# Patient Record
Sex: Female | Born: 1935 | Race: Black or African American | Hispanic: No | Smoking: Never smoker
Health system: Southern US, Community
[De-identification: ages and names within clinical notes are randomized; demographics above are authoritative.]

## PROBLEM LIST (undated history)

## (undated) DIAGNOSIS — Z951 Presence of aortocoronary bypass graft: Secondary | ICD-10-CM

## (undated) DIAGNOSIS — Z8673 Personal history of transient ischemic attack (TIA), and cerebral infarction without residual deficits: Secondary | ICD-10-CM

## (undated) DIAGNOSIS — N390 Urinary tract infection, site not specified: Secondary | ICD-10-CM

## (undated) DIAGNOSIS — I10 Essential (primary) hypertension: Secondary | ICD-10-CM

## (undated) DIAGNOSIS — F0390 Unspecified dementia without behavioral disturbance: Secondary | ICD-10-CM

## (undated) DIAGNOSIS — M109 Gout, unspecified: Secondary | ICD-10-CM

## (undated) DIAGNOSIS — E119 Type 2 diabetes mellitus without complications: Secondary | ICD-10-CM

---

## 2003-12-28 ENCOUNTER — Other Ambulatory Visit: Payer: Self-pay

## 2004-11-15 ENCOUNTER — Ambulatory Visit: Payer: Self-pay | Admitting: Unknown Physician Specialty

## 2004-12-20 ENCOUNTER — Ambulatory Visit: Payer: Self-pay | Admitting: Anesthesiology

## 2005-01-03 ENCOUNTER — Ambulatory Visit: Payer: Self-pay | Admitting: Anesthesiology

## 2005-02-07 ENCOUNTER — Ambulatory Visit: Payer: Self-pay | Admitting: Anesthesiology

## 2005-03-14 ENCOUNTER — Ambulatory Visit: Payer: Self-pay | Admitting: Anesthesiology

## 2005-04-04 ENCOUNTER — Ambulatory Visit: Payer: Self-pay | Admitting: Anesthesiology

## 2005-11-17 ENCOUNTER — Emergency Department: Payer: Self-pay | Admitting: Emergency Medicine

## 2005-11-17 ENCOUNTER — Other Ambulatory Visit: Payer: Self-pay

## 2006-05-30 ENCOUNTER — Other Ambulatory Visit: Payer: Self-pay

## 2006-05-31 ENCOUNTER — Inpatient Hospital Stay: Payer: Self-pay | Admitting: Internal Medicine

## 2006-07-01 ENCOUNTER — Ambulatory Visit: Payer: Self-pay | Admitting: Psychiatry

## 2010-09-03 ENCOUNTER — Observation Stay: Payer: Self-pay | Admitting: Internal Medicine

## 2011-04-22 ENCOUNTER — Ambulatory Visit: Payer: Self-pay | Admitting: Internal Medicine

## 2011-05-17 ENCOUNTER — Inpatient Hospital Stay: Payer: Self-pay | Admitting: Internal Medicine

## 2011-05-23 ENCOUNTER — Ambulatory Visit: Payer: Self-pay | Admitting: Internal Medicine

## 2011-05-31 ENCOUNTER — Emergency Department: Payer: Self-pay | Admitting: Emergency Medicine

## 2011-05-31 LAB — URINALYSIS, COMPLETE
Bacteria: NONE SEEN
Bilirubin,UR: NEGATIVE
Glucose,UR: NEGATIVE mg/dL (ref 0–75)
Leukocyte Esterase: NEGATIVE
Nitrite: NEGATIVE
Protein: NEGATIVE
RBC,UR: <1 /HPF (ref 0–5)
Specific Gravity: 1.012 (ref 1.003–1.030)
WBC UR: <1 /HPF (ref 0–5)

## 2011-06-01 LAB — URINE CULTURE

## 2011-06-18 ENCOUNTER — Emergency Department: Payer: Self-pay | Admitting: Unknown Physician Specialty

## 2011-06-18 LAB — URINALYSIS, COMPLETE
Bacteria: NONE SEEN
Bilirubin,UR: NEGATIVE
Blood: NEGATIVE
Glucose,UR: NEGATIVE mg/dL (ref 0–75)
Ketone: NEGATIVE
Leukocyte Esterase: NEGATIVE
Nitrite: NEGATIVE
Ph: 7 (ref 4.5–8.0)
Protein: NEGATIVE
RBC,UR: <1 /HPF (ref 0–5)
Specific Gravity: 1.006 (ref 1.003–1.030)
Squamous Epithelial: 1
WBC UR: 1 /HPF (ref 0–5)

## 2011-06-18 LAB — CBC
HCT: 33.3 % — ABNORMAL LOW (ref 35.0–47.0)
HGB: 10.7 g/dL — ABNORMAL LOW (ref 12.0–16.0)
MCH: 27 pg (ref 26.0–34.0)
MCHC: 32.2 g/dL (ref 32.0–36.0)
MCV: 84 fL (ref 80–100)
Platelet: 191 10*3/uL (ref 150–440)
RBC: 3.97 10*6/uL (ref 3.80–5.20)
RDW: 17.6 % — ABNORMAL HIGH (ref 11.5–14.5)
WBC: 4.4 10*3/uL (ref 3.6–11.0)

## 2011-06-18 LAB — BASIC METABOLIC PANEL
Anion Gap: 9 (ref 7–16)
Calcium, Total: 8.7 mg/dL (ref 8.5–10.1)
Chloride: 106 mmol/L (ref 98–107)
Co2: 30 mmol/L (ref 21–32)
EGFR (African American): 60 — ABNORMAL LOW
EGFR (Non-African Amer.): 49 — ABNORMAL LOW
Glucose: 155 mg/dL — ABNORMAL HIGH (ref 65–99)
Osmolality: 293 (ref 275–301)
Potassium: 3.9 mmol/L (ref 3.5–5.1)
Sodium: 145 mmol/L (ref 136–145)

## 2011-06-21 LAB — URINE CULTURE

## 2011-08-05 ENCOUNTER — Observation Stay: Payer: Self-pay | Admitting: Internal Medicine

## 2011-08-05 LAB — URINALYSIS, COMPLETE
Ph: 5 (ref 4.5–8.0)
Protein: NEGATIVE
RBC,UR: 2 /HPF (ref 0–5)
Squamous Epithelial: 14

## 2011-08-05 LAB — COMPREHENSIVE METABOLIC PANEL
Alkaline Phosphatase: 64 U/L (ref 50–136)
Anion Gap: 5 — ABNORMAL LOW (ref 7–16)
BUN: 26 mg/dL — ABNORMAL HIGH (ref 7–18)
Calcium, Total: 9 mg/dL (ref 8.5–10.1)
Chloride: 107 mmol/L (ref 98–107)
Co2: 31 mmol/L (ref 21–32)
EGFR (African American): 54 — ABNORMAL LOW
SGOT(AST): 26 U/L (ref 15–37)
SGPT (ALT): 20 U/L

## 2011-08-05 LAB — CBC
HCT: 32.1 % — ABNORMAL LOW (ref 35.0–47.0)
HGB: 10.4 g/dL — ABNORMAL LOW (ref 12.0–16.0)
MCH: 26.5 pg (ref 26.0–34.0)
MCV: 82 fL (ref 80–100)
Platelet: 265 10*3/uL (ref 150–440)
RBC: 3.92 10*6/uL (ref 3.80–5.20)

## 2011-08-06 LAB — URINE CULTURE

## 2011-08-12 LAB — CULTURE, BLOOD (SINGLE)

## 2011-09-09 ENCOUNTER — Emergency Department: Payer: Self-pay | Admitting: *Deleted

## 2011-09-09 LAB — URINALYSIS, COMPLETE
Blood: NEGATIVE
Glucose,UR: NEGATIVE mg/dL (ref 0–75)
Ketone: NEGATIVE
Leukocyte Esterase: NEGATIVE
Ph: 6 (ref 4.5–8.0)
RBC,UR: 1 /HPF (ref 0–5)
WBC UR: 1 /HPF (ref 0–5)

## 2011-09-09 LAB — CBC WITH DIFFERENTIAL/PLATELET
Basophil #: 0 10*3/uL (ref 0.0–0.1)
Eosinophil #: 0.1 10*3/uL (ref 0.0–0.7)
HCT: 35.7 % (ref 35.0–47.0)
Lymphocyte #: 1.3 10*3/uL (ref 1.0–3.6)
Lymphocyte %: 22.5 %
MCHC: 31.4 g/dL — ABNORMAL LOW (ref 32.0–36.0)
MCV: 82 fL (ref 80–100)
Monocyte #: 0.5 x10 3/mm (ref 0.2–0.9)
Neutrophil #: 3.8 10*3/uL (ref 1.4–6.5)
Platelet: 170 10*3/uL (ref 150–440)
RBC: 4.37 10*6/uL (ref 3.80–5.20)
RDW: 17.7 % — ABNORMAL HIGH (ref 11.5–14.5)
WBC: 5.7 10*3/uL (ref 3.6–11.0)

## 2011-09-09 LAB — TROPONIN I: Troponin-I: 0.02 ng/mL

## 2011-09-09 LAB — BASIC METABOLIC PANEL
Anion Gap: 7 (ref 7–16)
BUN: 16 mg/dL (ref 7–18)
Calcium, Total: 8.6 mg/dL (ref 8.5–10.1)
Chloride: 106 mmol/L (ref 98–107)
Co2: 31 mmol/L (ref 21–32)
Creatinine: 1.02 mg/dL (ref 0.60–1.30)
EGFR (African American): 58 — ABNORMAL LOW
EGFR (Non-African Amer.): 50 — ABNORMAL LOW
Glucose: 121 mg/dL — ABNORMAL HIGH (ref 65–99)
Potassium: 3.4 mmol/L — ABNORMAL LOW (ref 3.5–5.1)

## 2012-01-01 ENCOUNTER — Inpatient Hospital Stay: Payer: Self-pay | Admitting: Internal Medicine

## 2012-01-01 LAB — COMPREHENSIVE METABOLIC PANEL
BUN: 24 mg/dL — ABNORMAL HIGH (ref 7–18)
Bilirubin,Total: 0.4 mg/dL (ref 0.2–1.0)
Calcium, Total: 8.8 mg/dL (ref 8.5–10.1)
Chloride: 106 mmol/L (ref 98–107)
Co2: 27 mmol/L (ref 21–32)
EGFR (African American): 54 — ABNORMAL LOW
EGFR (Non-African Amer.): 46 — ABNORMAL LOW
Potassium: 4 mmol/L (ref 3.5–5.1)
SGOT(AST): 35 U/L (ref 15–37)
SGPT (ALT): 23 U/L (ref 12–78)
Sodium: 141 mmol/L (ref 136–145)
Total Protein: 7.8 g/dL (ref 6.4–8.2)

## 2012-01-01 LAB — URINALYSIS, COMPLETE
Bilirubin,UR: NEGATIVE
Ketone: NEGATIVE
Nitrite: NEGATIVE
Ph: 7 (ref 4.5–8.0)
Protein: NEGATIVE
RBC,UR: 3 /HPF (ref 0–5)
Squamous Epithelial: <1

## 2012-01-01 LAB — CBC
MCHC: 34.1 g/dL (ref 32.0–36.0)
RBC: 4.31 10*6/uL (ref 3.80–5.20)
RDW: 17.2 % — ABNORMAL HIGH (ref 11.5–14.5)

## 2012-01-02 LAB — BASIC METABOLIC PANEL
Anion Gap: 10 (ref 7–16)
Calcium, Total: 8.6 mg/dL (ref 8.5–10.1)
Chloride: 101 mmol/L (ref 98–107)
EGFR (African American): 53 — ABNORMAL LOW
Osmolality: 282 (ref 275–301)
Potassium: 3.5 mmol/L (ref 3.5–5.1)

## 2012-01-02 LAB — CBC WITH DIFFERENTIAL/PLATELET
Basophil #: 0.1 10*3/uL (ref 0.0–0.1)
Basophil %: 0.7 %
Eosinophil #: 0 10*3/uL (ref 0.0–0.7)
Eosinophil %: 0.6 %
HCT: 34.8 % — ABNORMAL LOW (ref 35.0–47.0)
HGB: 11.5 g/dL — ABNORMAL LOW (ref 12.0–16.0)
Lymphocyte %: 20.3 %
MCHC: 33.2 g/dL (ref 32.0–36.0)
MCV: 82 fL (ref 80–100)
Monocyte #: 0.8 x10 3/mm (ref 0.2–0.9)
Monocyte %: 9.9 %
Neutrophil #: 5.6 10*3/uL (ref 1.4–6.5)
RDW: 17.5 % — ABNORMAL HIGH (ref 11.5–14.5)

## 2012-01-03 LAB — URINE CULTURE

## 2012-01-05 ENCOUNTER — Encounter: Payer: Self-pay | Admitting: Internal Medicine

## 2012-01-05 LAB — CULTURE, BLOOD (SINGLE)

## 2012-01-21 ENCOUNTER — Encounter: Payer: Self-pay | Admitting: Internal Medicine

## 2013-01-13 IMAGING — CR DG CHEST 1V PORT
1 series · 1 of 1 positions shown · non-contrast
Comparison: none

REASON FOR EXAM: WEAKNESS
COMMENTS:

[portable]
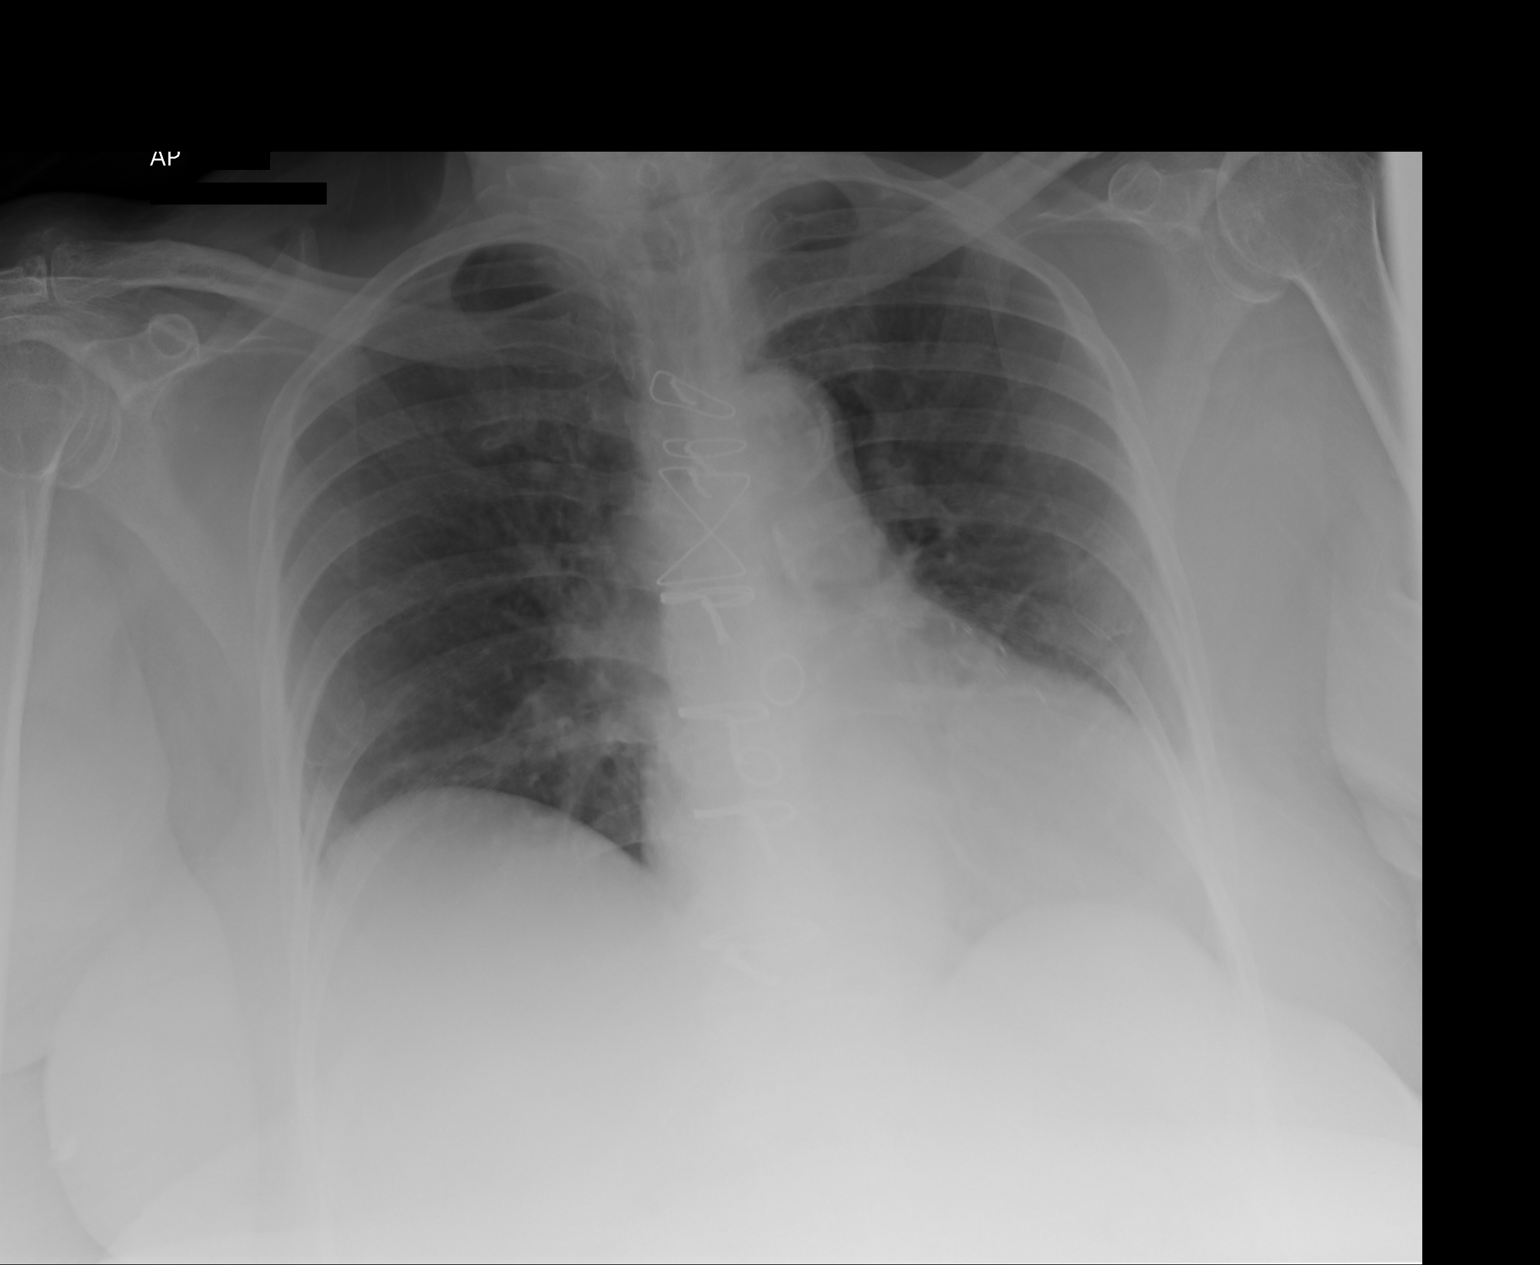

[1 of 1 positions shown; findings below may reference images not displayed]

PROCEDURE:     DXR - DXR PORTABLE CHEST SINGLE VIEW  - January 01, 2012  [DATE]

RESULT:

Comparison is made to the study 18 June, 2011.

There is a band of density at the right lung base suggestive of atelectasis.
The heart is borderline enlarged to mildly enlarged without edema, effusion
or pneumothorax. CABG changes are present.
IMPRESSION: 1.     Right lung base atelectasis.
2.     Borderline to mild cardiomegaly. CABG.

[REDACTED]

## 2013-05-30 ENCOUNTER — Other Ambulatory Visit: Payer: Self-pay | Admitting: Family Medicine

## 2013-05-30 LAB — URINALYSIS, COMPLETE
BLOOD: NEGATIVE
Bilirubin,UR: NEGATIVE
Glucose,UR: NEGATIVE mg/dL (ref 0–75)
Ketone: NEGATIVE
Nitrite: NEGATIVE
Ph: 8 (ref 4.5–8.0)
Protein: >=500
RBC,UR: 8 /HPF (ref 0–5)
SPECIFIC GRAVITY: 1.022 (ref 1.003–1.030)
WBC UR: 21 /HPF (ref 0–5)

## 2013-06-03 LAB — URINE CULTURE

## 2014-09-08 NOTE — H&P (Signed)
PATIENT NAME:  Stephanie Salas, Stephanie Salas MR#:  [id number] DATE OF BIRTH:  [birth year]  DATE OF ADMISSION:  01/01/2012  PRIMARY CARE PHYSICIAN:  Francia Greavesheryl Jeffries, MD   CHIEF COMPLAINT:  The patient was transferred from assisted nursing facility with generalized weakness and low-grade temperature.   HISTORY OF PRESENT ILLNESS: Stephanie Salas is an 81 year old African American female with history of Alzheimer's dementia, systemic hypertension, and coronary artery disease. The patient was noticed to have generalized weakness. The nursing staff at the assisted nursing facility felt  that she was generally weaker than usual and barely able to stand up and walk. She was noticed to have a low-grade fever. The patient was transferred by EMS to the hospital for further evaluation. The patient denies to me everything and states she is fine. Work-up here is unremarkable except for a temperature of 100.     REVIEW OF SYSTEMS: CONSTITUTIONAL: The patient denies having fever, although she has a low-grade fever. No chills, but she has generalized fatigue- even that she is not admitting.  EYES: No blurring of vision. No double vision. ENT: No hearing impairment. No sore throat. No dysphagia. CARDIOVASCULAR: No chest pain. Denies any shortness of breath. No palpitations. RESPIRATORY: No cough. No sputum production. No chest pain. No shortness of breath. GASTROINTESTINAL: No abdominal pain. No vomiting. No diarrhea. GENITOURINARY: No dysuria or frequency of urination. MUSCULOSKELETAL: No joint pain or swelling. No muscular pain or swelling. INTEGUMENT: No skin rash. No ulcers.  NEUROLOGIC: No focal weakness but she has generalized weakness. No seizure activity. No headache. PSYCHIATRY: No anxiety. No depression. ENDOCRINE: No polyuria, no polydipsia. No heat or cold intolerance.   PAST MEDICAL HISTORY:  1. Alzheimer's dementia.  2. History of stroke in the past.  3. Systemic hypertension.  4. Mitral regurgitation. 5. Coronary  artery disease status post coronary artery bypass graft. 6. Gout. 7. Right eye blindness.  8. Diabetes mellitus.  9. Hyperlipidemia. 10. Osteoarthritis.   PAST SURGICAL HISTORY:  1. Coronary artery bypass graft.  2. Hysterectomy. 3. Bilateral knee joint replacements.   SOCIAL HABITS: The patient does not provide much history about her habits, but according to her old records there is no history of tobacco or alcohol or drug abuse.   SOCIAL HISTORY: She used to live with her daughter in [place], but right now she is in [facility] assisted facility.   FAMILY HISTORY: According to her medical records, her mother lived to be 100 without any significant health issues.   ADMISSION MEDICATIONS:  1. Allopurinol 100 mg once a day. 2. Colcrys 0.6 mg once a day. 3. Amlodipine 5 mg once a day. 4. Aricept or donepezil 10 mg a day. 5. Namenda 10 mg once a day. 6. Pantoprazole or Protonix 40 mg once a day. 7. Metoprolol tartrate 25 mg twice a day.  8. Plavix 75 mg once a day. 9. Lisinopril 20 mg once a day. 10. Hydrocodone with acetaminophen 5/325, 1 to 2 tablets every four hours p.r.n. for pain.  11. Aspirin 81 mg a day. 12. Pravastatin 40 mg a day. 13. Zofran 4 mg q. 6 hours p.r.n. for nausea or vomiting.   ALLERGIES: Penicillin.   PHYSICAL EXAMINATION:  VITAL SIGNS: Blood pressure 142/90,  respiratory rate 18, pulse 87, temperature 100, oxygen saturation 96% on room air.   GENERAL APPEARANCE: Elderly lady lying in bed in no acute distress.   HEAD: No pallor. No icterus. No cyanosis.   ENT: Hearing was normal. Nasal mucosa, lips, tongue were  normal.   EYES: Eye examination revealed normal eyelids and conjunctivae. Pupils about 4 to 5 mm, equal and reactive to light.   NECK: Supple. Trachea at midline. No thyromegaly. No cervical lymphadenopathy. No masses.   HEART: Normal S1, S2. No S3, S4. No murmur. No gallop. No carotid bruits.   RESPIRATORY: Normal breathing  pattern without use of accessory muscles.  Few scattered rales at the bases. No wheezing.   ABDOMEN: Soft without tenderness. No hepatosplenomegaly. No masses. No hernias.   SKIN: No ulcers. No subcutaneous nodules.   MUSCULOSKELETAL: No joint swelling. No clubbing.   NEUROLOGIC: Cranial nerves II through XII intact. No focal motor deficits.   PSYCHIATRY: The patient is alert, does not know where she is or why she is here. Disoriented to time as well.   LABS/STUDIES: Chest x-ray showed no consolidation, no effusion. EKG showed normal sinus rhythm at a rate of 87 per minute, first-degree AV block, left axis deviation, otherwise unremarkable EKG. Serum glucose 108, BUN 24, creatinine 1.09, sodium 141, potassium 4. Liver function tests were normal except for slightly low albumin at 3.3. CBC showed white count of 9400, hemoglobin 12, hematocrit 35, platelet count 156. Urinalysis was unremarkable.   ASSESSMENT:  1. Generalized weakness. Etiology is unclear but could be viral prodrome syndrome versus infection of other etiology.  2. Low-grade fever. The source is unclear.  3. Alzheimer's dementia.  4. Systemic hypertension.  5. Coronary artery disease.  6. History of stroke. 7. Multiple presentations to the hospital with syncope-like diagnosis.  8. Osteoarthritis.  9. Diabetes mellitus.  10. Hyperlipidemia.  11. History of mitral regurgitation.   PLAN: We will admit the patient to the medical floor. Blood cultures times two were taken, urine for culture as well. Monitor her temperature. The patient received an order for ciprofloxacin for presumed urinary tract infection. However, I do not have this evidence. I will hold any antibiotic treatment until I have clarification pending results of the blood and urine cultures. I will involve and consult with physical therapy. Continue the rest of her home medications as listed above. I cannot assess with the patient if she has a LIVING WILL given her  dementia.   TIME SPENT: Time spent evaluating this patient and reviewing medical records and obtaining the medication list from the nursing home, which was not sent with the patient: More than one hour.   ____________________________ Carney Corners. Rudene Re, MD amd:bjt D: 01/01/2012 04:51:19 ET T: 01/01/2012 09:20:24 ET JOB#: [id number]  cc: Carney Corners. Rudene Re, MD, <Dictator> Leitha Bleak. Lin Givens, MD Carney Corners Javin Nong MD ELECTRONICALLY SIGNED 01/01/2012 22:17

## 2014-09-08 NOTE — Discharge Summary (Signed)
PATIENT NAME:  Stephanie Salas, Quinetta M MR#:  [id number] DATE OF BIRTH:  Jan 01, 1924  DATE OF ADMISSION:  01/01/2012 DATE OF DISCHARGE:  01/04/2012  PRIMARY CARE PHYSICIAN: Francia Greavesheryl Jeffries, MD  FINAL DIAGNOSES:  1. Weakness.  2. Low-grade fever, possible pneumonia.  3. History of cerebrovascular accident.  4. Hypertension.  5. Gout.  6. Hyperlipidemia.  7. Dementia.   DISCHARGE MEDICATIONS:   1. Protonix 40 mg daily.  2. Metoprolol 25 mg twice a day. 3. Plavix 75 mg daily.  4. Namenda 10 mg twice a day. 5. Lisinopril 20 mg daily.  6. Aspirin 81 mg daily.  7. Colchicine 0.6 mg daily.  8. Norvasc 5 mg daily.  9. Allopurinol 100 mg daily.  10. Norco 5/325 mg 1 tablet every six hours as needed for pain.  11. Benazepril 10 mg at bedtime.  12. Acetaminophen 325 mg 1 tablet every four hours as needed for pain or temperature.  13. Zofran 4 mg every eight hours as needed for nausea.  14. Levaquin 500 mg every day for seven days, then stop.   NOTE: Do not take pravastatin.   DIET: Low sodium diet, regular consistency.  ACTIVITY: As tolerated.   REFERRAL: Physical therapy.   DISCHARGE FOLLOWUP: Follow up in 1 to 2 days with doctor at rehab.   REASON FOR ADMISSION: The patient was admitted 01/01/2012 and discharged 01/04/2012. She came in with weakness and low-grade temperature.   HISTORY OF PRESENT ILLNESS: This is an 81 year old female with Alzheimer's dementia, weaker and unable to walk and had a low-grade temp. The patient was given a dose of Cipro in the Emergency Room for possible urinary tract infection. Unclear initially where the source was.   LABORATORY, DIAGNOSTIC AND RADIOLOGIC DATA: EKG showed first-degree AV block, septal infarct.   Blood culture grew out Coag-negative staph, possible contamination with skin flora in one bottle and the other three bottles were negative.   Glucose 108, BUN 24, creatinine 1.09, sodium 141, potassium 4.0, chloride 106, CO2 27, and calcium 8.8.  Liver function tests: Albumin low at 3.3. GFR was 54. White blood cell count 9.4, hemoglobin and hematocrit 12.0 and 35.2, and platelet count 156.   Urine culture negative.   First chest x-ray showed right lung base atelectasis.   Urinalysis showed trace leukocyte esterase.   White count remained normal, 8.1 upon discharge.   Repeat chest x-ray showed mild interstitial edema versus interstitial infiltrate.   HOSPITAL COURSE PER PROBLEM LIST:  1. For the patient's weakness, physical therapy did see the patient. The patient would not be accepted back to the patient's current facility and needs more care and will be sent to rehab to try to get the patient stronger.  2. Low-grade fever, possibility of pneumonia on the repeat chest x-ray. Initially Cipro was started. With a positive blood culture vancomycin was added and then the blood culture came back contaminant, vancomycin was stopped and the patient's Cipro was switched over to Levaquin for better coverage on the lung with repeat chest x-ray showing possible interstitial infiltrate. Temperature curve came down, afebrile upon discharge.  3. History of cerebrovascular accident. On aspirin and Plavix.  4. Hypertension. Blood pressure was variable during the hospitalization. Most of the time good, sometimes low, sometimes high depending on the patient's mood. Continue current medications.  5. Gout. The patient is on allopurinol and colchicine.  6. Hyperlipidemia. I stopped the statin with the patient's weakness.  7. Dementia. The patient is on Aricept and Namenda.  8. Chronic  kidney disease, stage III. Stable.  9. Gastroesophageal reflux disease. On Protonix.  TIME SPENT ON ADMISSION: 55 minutes.  ____________________________ Herschell Dimes. Renae Gloss, MD rjw:slb D: 01/04/2012 10:14:29 ET T: 01/04/2012 10:45:48 ET JOB#: [id number]  cc: Herschell Dimes. Renae Gloss, MD, <Dictator> Cheryl L. Lin Givens, MD Salley Scarlet MD ELECTRONICALLY SIGNED 01/05/2012  15:43

## 2014-09-08 NOTE — Discharge Summary (Signed)
PATIENT NAME:  Stephanie Salas, Analeese M MR#:  [id number] DATE OF BIRTH:  13-Oct-1923  DATE OF ADMISSION:  01/01/2012 DATE OF DISCHARGE:  01/05/2012  ADDENDUM  This is an addendum to the discharge summary dictated yesterday. Please see that for all discharge instructions. The reason why the patient had to stay in the hospital overnight is the insurance company did not approve rehab until today, 01/05/2012. We finally got approval. Patient will be sent out to rehab. There was no change in the patient's clinical condition. Patient did have temperature of 99.1. Medications: No change. Patient stable for discharge.   TIME SPENT ON DISCHARGE: Less than 30 minutes.   ____________________________ Herschell Dimesichard J. Renae GlossWieting, MD rjw:cms D: 01/05/2012 11:39:46 ET T: 01/05/2012 11:45:04 ET JOB#: [id number]  cc: Herschell Dimesichard J. Renae GlossWieting, MD, <Dictator>  Salley ScarletICHARD J Garcia Dalzell MD ELECTRONICALLY SIGNED 01/05/2012 15:44

## 2014-09-13 NOTE — H&P (Signed)
PATIENT NAME:  Stephanie Salas, Stephanie Salas MR#:  [id number] DATE OF BIRTH:  [birth year]  DATE OF ADMISSION:  05/17/2011  REFERRING PHYSICIAN: Dr. Manson PasseyBrown   PRIMARY CARE PHYSICIAN: Dr. Lin GivensJeffries    PRESENTING COMPLAINT: Altered mental status.   HISTORY OF PRESENT ILLNESS: Stephanie Salas is an 81 year old woman with history of Alzheimer's dementia, CVA, hypertension, coronary artery disease status post bypass, and hyperlipidemia who presents with her grandson and her granddaughter. The patient herself is unable to provide history. According to her grandson who does not reside with her, he saw her today after being told by his mother that she has not been feeling well. She has been complaining of pain in her feet with gout flare. She has not been getting out of bed, difficulty even with assistance to transfer her out of the bed to the commode or to a chair. She apparently had a similar episode back in April 2012 where she was admitted for evaluation of questionable recurrent syncope. Apparently she goes in and out of consciousness. She becomes limp and lethargic and loses desire to eat. Her granddaughter attempted to give her Gatorade today and she had a vomiting spell after the intake again similar to her last episode. She sometimes has some urine and bowel incontinence but this time did not have that. At baseline her family reports that she has in and out state of awareness but her grandson reports that her dementia may be getting worse although it is not clear based on what his mother has been relaying. It also appears that this is her second episode this year and last year she had one episode.   PAST MEDICAL HISTORY:  1. Admitted April 2012 for evaluation of multiple syncopal episodes. She had an echocardiogram that showed mitral regurgitation.  2. History of cerebrovascular accident.  3. Gout.  4. Hypertension.  5. Mitral regurgitation.  6. Alzheimer's dementia.  7. Coronary artery disease status post three  vessel bypass and MI.  8. Right eye blindness.   9. Diet controlled diabetes.  10. Anemia.  11. Osteoarthritis/degenerative disk disease/spinal stenosis.  12. Hyperlipidemia.  13. Allergic rhinitis.   PAST SURGICAL HISTORY:  1. History of bypass in 2000.  2. Hysterectomy.  3. Bilateral knee replacement.   SOCIAL HISTORY: She lives with her daughter in [place]. No tobacco, alcohol, or drug use.   FAMILY HISTORY: Her mother lived to be 100 without any significant health issues.   ALLERGIES: Penicillin.   MEDICATIONS:  1. Donepezil 10 mg daily.  2. Lisinopril/hydrochlorothiazide 20/12.5 daily.  3. Aspirin 81 mg daily.  4. Namenda 10 mg b.i.d.  5. Omeprazole 20 mg daily.  6. Plavix 75 mg daily.  7. Pravastatin 40 mg daily.  8. Naprosyn 500 mg daily.  9. Metoprolol tartrate 25 mg b.i.d.   REVIEW OF SYSTEMS: Unable to obtain due to the patient's dementia.   PHYSICAL EXAMINATION:   VITAL SIGNS: Temperature 98.1, pulse 81, respiratory rate 18, blood pressure 175/79, sating at 93% on room air.   GENERAL: Lying in bed in no apparent distress.   HEENT: Normocephalic, atraumatic. Pupils equal, symmetric. Nares without discharge. Moist mucous membrane.   NECK: Soft and supple. No adenopathy or JVP.   CARDIOVASCULAR: Non-tachy. No murmurs, rubs, or gallops.   LUNGS: Clear to auscultation bilaterally. No use of accessory muscles or increased respiratory effort.   ABDOMEN: Soft. Positive bowel sounds. No mass appreciated.   EXTREMITIES: She has no edema but minimal palpation of her left foot and left great  toe causes pain.   NEUROLOGIC: The patient has symmetrical squeeze.   PSYCH: She is alert but not oriented.   PERTINENT LABS AND STUDIES: Urinalysis with specific gravity of 1.002, blood 1+, pH 5, RBC less than 1 per high-power field, WBC less than 1 per high-power field. CK 65. MB 0.6. Troponin less than 0.02. INR 1.1. WBC 7.7, hemoglobin 14.7, hematocrit 44.7, platelets  188, MCV 84, glucose 191, BUN 21, creatinine 1.37, sodium 134, potassium 3.9, chloride 95, carbon dioxide 28, calcium 9.1, albumin 3.3. Ultrasound of the abdomen reportedly with gallstones. CT of the head without acute findings.   ASSESSMENT AND PLAN: Stephanie Salas is an 81 year old woman with history of Alzheimer's dementia, CVA, hypertension, coronary artery disease, mitral regurgitation, diet controlled diabetes, anemia, and osteoarthritis presenting with altered mental status, failure to thrive, nausea and vomiting.  1. Altered mental status, questionable syncope versus seizure versus TIA versus progression of dementia. Discussed at length with family. Her grandson appears receptive in looking at alternative care. She was admitted with a similar episode back in April 2012. Her echo was unrevealing except for mitral regurgitation and again CT of the head shows no acute findings. Will obtain Neurology consult. Obtain an EEG. Will hold off on MRI unless recommended by Neurology. If these truly are seizure episodes given this is her second event, questionable if initiating medication would be warranted. This can be decided via Neurology. Will send a prolactin level, ammonia, B12, folate, TSH. Obtain Palliative Care consultation.  2. Renal insufficiency and dehydration with poor p.o. and vomiting. Continue IV fluids. Daily creatinine and I's and O's. Ultrasound showing only for gallstones.  3. Gout flare. Hold HCTZ. Restart Naprosyn and start her on colchicine b.i.d. Hold off on steroids given presenting with delirium. Get PT evaluation.  4. Hypertension, uncontrolled. Restart lisinopril. As above, hold HCTZ.  5. Cerebrovascular accident. Restart Plavix, aspirin, and pravastatin. As above, questionable if these episodes are related to TIA event. Will monitor patient on off-unit tele.  6. Prophylaxis with aspirin, Lovenox, Plavix, and omeprazole.   TIME SPENT: Approximately 50 minutes spent on patient care.    ____________________________ Reuel Derby, MD ap:drc D: 05/17/2011 06:26:18 ET T: 05/17/2011 09:21:47 ET JOB#: [id number]  cc: Tunis Gentle, MD, <Dictator> Cheryl L. Lin Givens, MD Reuel Derby MD ELECTRONICALLY SIGNED 06/10/2011 2:21

## 2014-09-13 NOTE — H&P (Signed)
PATIENT NAME:  Stephanie Salas, Stephanie Salas MR#:  [id number] DATE OF BIRTH:  [birth year]  DATE OF ADMISSION:  08/05/2011  PRIMARY CARE PHYSICIAN: Francia Greaves, MD  CHIEF COMPLAINT: Painful right foot.   HISTORY OF PRESENT ILLNESS: This is an 81 year old female who a few days ago started having swelling and redness in her right ankle and foot. She does have a history of gout. She was started on some colchicine.  She has had very little relief from it and it has gotten to the point that she cannot ambulate anymore. She is demented so it is hard to get a history from her, but the foot is obviously swollen. She has had no fever or chills that we know of.   PAST MEDICAL HISTORY:  1. Gouty arthritis.  2. History of cerebrovascular accident.  3. Alzheimer's dementia.  4. Hypertension.  5. Coronary artery disease.  6. Right eye blindness.  7. Diet-controlled diabetes.  8. Anemia.  9. Hyperlipidemia.  10. Osteoarthritis.   PAST SURGICAL HISTORY:  1. Coronary artery bypass graft.  2. Hysterectomy.  3. Bilateral knee replacements.   ALLERGIES: Penicillin.   CURRENT MEDICATIONS:  1. Pravastatin 40 mg daily.  2. Colchicine 0.6 mg twice a day. 3. Percocet 5/325 mg every 4 hours p.r.n.  4. Tylenol 650 mg every 4 hours p.r.n.  5. Zofran 4 mg every six hours p.r.n.  6. Amlodipine 5 mg daily.  7. Allopurinol 100 mg daily.  8. Donepezil 10 mg daily.  9. Protonix 40 mg daily.  10. Metoprolol 25 mg twice a day. 11. Plavix 75 mg daily.  12. Namenda 10 mg twice a day. 13. Lisinopril 20 mg daily.  14. Aspirin 81 mg daily.   SOCIAL HISTORY: She lives in assisted living.   FAMILY HISTORY: Unable to obtain from the patient because of her dementia.  REVIEW OF SYSTEMS: Unable to obtain from the patient because of her dementia.   PHYSICAL EXAMINATION:   VITAL SIGNS: Temperature 97.2, pulse 82, respirations 18, blood pressure 110/59.   GENERAL: This is a well-nourished white female who is pleasantly  demented.   HEENT: Pupils are equal, round, and reactive to light. Sclerae anicteric. Oral mucosa is moist. Oropharynx is clear. Nasopharynx is clear.   NECK: Supple. No JVD, lymphadenopathy or thyromegaly.   HEART: Regular rate and rhythm. No murmurs, rubs, or gallops.   LUNGS: Clear to auscultation. No dullness to percussion. She is not using accessory muscles.   ABDOMEN: Soft, nontender, and nondistended. Bowel sounds are positive. No hepatosplenomegaly. No masses.   EXTREMITIES: Left lower extremity has no edema. The right ankle is edematous with some erythema, very tender to even light touch.   NEUROLOGIC: Cranial nerves II through XII are grossly intact. She is alert but not oriented.   SKIN: Moist with no rash.  LABS/STUDIES: Urinalysis shows white blood cells and bacteria.   White blood cells 6 and hemoglobin is 10.4.   ASSESSMENT AND PLAN:  1. Acute gouty arthritis: She is obviously having a flare-up of her gout. She has been on some colchicine. We will continue that. I will go ahead and add IV steroids for acute treatment. There is a question about some possible cellulitis, but she will be on antibiotics for her UTI that should cover this anyway. 2. Urinary tract infection: We will get cultures and start her on some IV Levaquin.  3. Dementia: This appears to be stable.  4. Coronary artery disease: We will continue her current medications.   TIME  SPENT ON ADMISSION: 40 minutes. ____________________________ Gracelyn NurseJohn D. Roslind Michaux, MD jdj:slb D: 08/05/2011 10:00:43 ET T: 08/05/2011 10:27:26 ET JOB#: [id number]  cc: Gracelyn NurseJohn D. Asier Desroches, MD, <Dictator> Cheryl L. Lin GivensJeffries, MD Gracelyn NurseJOHN D Shahara Hartsfield MD ELECTRONICALLY SIGNED 08/05/2011 15:15

## 2014-09-13 NOTE — Discharge Summary (Signed)
PATIENT NAME:  Stephanie Salas, Stephanie Salas MR#:  [id number] DATE OF BIRTH:  [birth year]  DATE OF ADMISSION:  08/05/2011 DATE OF DISCHARGE:  08/06/2011  ADMITTING PHYSICIAN: Dr. Marcelino DusterJohn Johnston DISCHARGING PHYSICIAN:  Dr. Larena GlassmanAmir Zakyra [place] PRIMARY CARE PHYSICIAN: Dr. Francia Greavesheryl Jeffries   ADMITTING DIAGNOSIS: Painful right foot.  DISCHARGE DIAGNOSES:  1. Painful right foot secondary to acute gouty attack.  2. Debilitation.  3. Hypertension.  4. Dementia.  5. Urinary tract infection.  6. Coronary artery disease.  7. Hyperlipidemia.    TESTS DONE DURING HOSPITALIZATION: Right foot complete x-ray 03/16 showed there are periarticular erosive changes at the first MTP. There is periarticular erosion along the medial base of the first proximal phalanx. There is amorphous calcification adjacent to the medial aspect of the first metatarsal head. These findings can be seen in a crystalline arthropathy such as gout.   HOSPITAL COURSE: Initial History and Physical were done by Dr. Marcelino DusterJohn Johnston. Please refer to his note dated 08/05/2011 for complete details. In brief, this is an 81 year old African American female with history of Alzheimer's dementia, hypertension, cerebrovascular accident, and gout who presented with redness in her right ankle and foot. The patient has a history of gout and was started on some colchicine with little or no relief. She has dementia and it was hard to get a proper history on admission. She was admitted to the hospitalist service.  1. Acute gouty arthritis, having flare-up of gout. The patient had been on some colchicine. She was  continued on colchicine and allopurinol. She was on IV Solu-Medrol and switched over to prednisone prior to discharge. Her swelling improved significantly. She was evaluated by PT and recommended for Home Health and physical therapy.  2. Urinary tract infection based on urinalysis. The patient received two days of IV Levaquin and was switched to oral Levaquin upon  discharge for five additional days. 3. Alzheimer's dementia. This has been stable. The patient was continued on Namenda and Aricept. 4. Coronary artery disease. The patient was continued on aspirin and Plavix.  5. Hyperlipidemia. The patient was continued on statin.  6. Hypertension. The patient was stable on metoprolol, lisinopril, and amlodipine. 7. Deep vein thrombosis prophylaxis was maintained with Lovenox and aspirin.  The patient was discharged on 08/06/2011. Temperature 98.1, heart rate 69, respiratory rate 20, blood pressure 151/73, sating 95% on room air.  Lungs clear to auscultation.  Cardiovascular- regular rate and rhythm. Abdomen benign.   DISCHARGE MEDICATIONS:  1. MAPAP 325, 2 tablets every four hours as needed. 2. Zofran 4 mg every eight hours as needed.  3. Percocet 5/325, 1 tablet p.o. q. 6 hours as needed.  4. Aricept 10 mg once a day.  5. Protonix 40 mg daily.  6. Metoprolol tartrate 25 mg 1 tablet twice daily.  7. Plavix 75 mg daily.  8. Namenda 10 mg 2 times a day.  9. Lisinopril 20 mg daily.  10. Aspirin 81 mg daily.  11. Pravastatin 40 mg daily.  12. Colchicine 0.6 mg daily.  13. Norvasc 5 mg daily. 14. Allopurinol 100 mg daily.  15. Levaquin 500 mg p.o. daily times #5. 16. Prednisone 40 mg, taper by 10 mg every two days.  DIET:  ADA diet.   ACTIVITY: As tolerated with assistance and home physical therapy.  CODE STATUS: FULL CODE.  FOLLOWUP: She should follow up with Dr. Lin GivensJeffries or whoever she finds as her primary care physician to replace Dr. Lin GivensJeffries in one week.      TOTAL TIME SPENT  ON DISCHARGE: 45 minutes.     ____________________________ Corie Chiquito Lafayette Dragon, MD aaf:bjt D: 08/06/2011 22:50:12 ET T: 08/08/2011 11:11:08 ET JOB#: [id number]  cc: Karolee Ohs A. Lafayette Dragon, MD, <Dictator> Cheryl L. Lin Givens, MD Coralyn Pear MD ELECTRONICALLY SIGNED 08/08/2011 21:32

## 2014-09-13 NOTE — Discharge Summary (Signed)
PATIENT NAME:  Stephanie Salas, Stephanie Salas MR#:  [id number] DATE OF BIRTH:  [birth year]  DATE OF ADMISSION:  05/17/2011 DATE OF DISCHARGE:  05/22/2011  REASON FOR ADMISSION: Altered mental status.   HISTORY OF PRESENT ILLNESS: Please see the dictated HPI done by Dr. Pearlean BrownieAlounthith Phichith on 05/17/2011.   PAST MEDICAL HISTORY:  1. Alzheimer's dementia.  2. Previous stroke.  3. Arteriosclerotic cardiovascular disease status post coronary artery bypass grafting.  4. Benign hypertension.  5. Hyperlipidemia.  6. Gout.  7. Mitral regurgitation.  8. Right eye blindness.  9. Type 2 diabetes.  10. Anemia of chronic disease.  11. Degenerative disk disease with spinal stenosis.  12. Osteoarthritis.   MEDICATIONS ON ADMISSION: Please see admission note.   ALLERGIES: Penicillin.   SOCIAL HISTORY, FAMILY HISTORY AND REVIEW OF SYSTEMS: As per admission note.   PHYSICAL EXAM: The patient was in no acute distress. Vital signs were stable and she was afebrile. HEENT exam was unremarkable. Neck was supple without jugular venous distention. Lungs were clear. Cardiac exam revealed a regular rate and rhythm with normal S1 and S2. Abdomen was soft and nontender. Extremities were without edema. Neurologic exam was grossly nonfocal.   HOSPITAL COURSE: The patient was admitted with altered mental status with questionable syncope versus transient ischemic attack versus progressive dementia. The patient was admitted to the medical floor and monitored. Palliative care consult was obtained. Her altered mental status stabilized without further evidence of stroke or seizures. No evidence of infection was found during the hospitalization. Neurologic examination did not suggest an acute neurologic event. It was felt to be progression of the patient's underlying dementia associated with hypertension. Her medications were adjusted to improve her blood pressure. There was some component of metabolic encephalopathy due to hypertension;  this improved. Placement was recommended and the family agreed to assisted living placement. The patient was discharged to assisted living on 05/22/2011 in stable condition.   DISCHARGE DIAGNOSES:  1. Accelerated hypertension.  2. Hypertensive encephalopathy.  3. Progressive dementia. 4. Previous stroke.  5. Hyperlipidemia.  6. Type 2 diabetes.  7. Anemia of chronic disease.  8. Alzheimer's dementia.   DISCHARGE MEDICATIONS:  1. Aricept 10 mg p.o. at bedtime.  2. Lopressor 25 mg p.o. twice a day. 3. Namenda 10 mg p.o. twice a day. 4. Pravastatin 40 mg p.o. at bedtime.  5. Norvasc 10 mg p.o. daily.  6. Aspirin 81 mg p.o. daily.  7. Zestril 20 mg p.o. daily.  8. Plavix 75 mg p.o. daily.  9. Protonix 40 mg p.o. daily.  10. Zofran 4 mg p.o. every six hours p.r.n. nausea.  11. Norco 5/325 mg 1 to 2 p.o. every four hours p.r.n. pain.  FOLLOW-UP PLANS AND APPOINTMENTS: The patient was discharged on a mechanical soft diet. She was instructed not to take her omeprazole, lisinopril/HCT, or colchicine. She will    be at the assisted living facility. She will follow up with her regular physician in 1 to 2 weeks, sooner if needed.  ____________________________ Duane LopeJeffrey D. Judithann SheenSparks, MD jds:slb D: 06/03/2011 01:29:00 ET T: 06/04/2011 12:50:50 ET JOB#: [id number]  cc: Duane LopeJeffrey D. Judithann SheenSparks, MD, <Dictator> Stacey Sago Rodena Medin Lanie Schelling MD ELECTRONICALLY SIGNED 06/04/2011 13:11

## 2015-02-05 ENCOUNTER — Inpatient Hospital Stay
Admission: EM | Admit: 2015-02-05 | Discharge: 2015-02-20 | DRG: 393 | Disposition: E | Payer: Medicare Other | Attending: Internal Medicine | Admitting: Internal Medicine

## 2015-02-05 ENCOUNTER — Inpatient Hospital Stay: Payer: Medicare Other

## 2015-02-05 ENCOUNTER — Emergency Department: Payer: Medicare Other

## 2015-02-05 ENCOUNTER — Encounter: Payer: Self-pay | Admitting: Intensive Care

## 2015-02-05 DIAGNOSIS — E860 Dehydration: Secondary | ICD-10-CM | POA: Diagnosis present

## 2015-02-05 DIAGNOSIS — Z888 Allergy status to other drugs, medicaments and biological substances status: Secondary | ICD-10-CM | POA: Diagnosis not present

## 2015-02-05 DIAGNOSIS — Z993 Dependence on wheelchair: Secondary | ICD-10-CM | POA: Diagnosis not present

## 2015-02-05 DIAGNOSIS — R7989 Other specified abnormal findings of blood chemistry: Secondary | ICD-10-CM | POA: Diagnosis present

## 2015-02-05 DIAGNOSIS — Z79899 Other long term (current) drug therapy: Secondary | ICD-10-CM

## 2015-02-05 DIAGNOSIS — L8990 Pressure ulcer of unspecified site, unspecified stage: Secondary | ICD-10-CM | POA: Diagnosis not present

## 2015-02-05 DIAGNOSIS — K5900 Constipation, unspecified: Secondary | ICD-10-CM | POA: Diagnosis present

## 2015-02-05 DIAGNOSIS — N170 Acute kidney failure with tubular necrosis: Secondary | ICD-10-CM | POA: Diagnosis present

## 2015-02-05 DIAGNOSIS — E861 Hypovolemia: Secondary | ICD-10-CM | POA: Diagnosis present

## 2015-02-05 DIAGNOSIS — E878 Other disorders of electrolyte and fluid balance, not elsewhere classified: Secondary | ICD-10-CM | POA: Diagnosis present

## 2015-02-05 DIAGNOSIS — L89152 Pressure ulcer of sacral region, stage 2: Secondary | ICD-10-CM | POA: Diagnosis present

## 2015-02-05 DIAGNOSIS — Z66 Do not resuscitate: Secondary | ICD-10-CM | POA: Diagnosis present

## 2015-02-05 DIAGNOSIS — E870 Hyperosmolality and hypernatremia: Secondary | ICD-10-CM | POA: Diagnosis present

## 2015-02-05 DIAGNOSIS — R748 Abnormal levels of other serum enzymes: Secondary | ICD-10-CM

## 2015-02-05 DIAGNOSIS — I248 Other forms of acute ischemic heart disease: Secondary | ICD-10-CM | POA: Diagnosis present

## 2015-02-05 DIAGNOSIS — K5790 Diverticulosis of intestine, part unspecified, without perforation or abscess without bleeding: Secondary | ICD-10-CM | POA: Diagnosis present

## 2015-02-05 DIAGNOSIS — G9341 Metabolic encephalopathy: Secondary | ICD-10-CM | POA: Diagnosis present

## 2015-02-05 DIAGNOSIS — E559 Vitamin D deficiency, unspecified: Secondary | ICD-10-CM | POA: Diagnosis present

## 2015-02-05 DIAGNOSIS — I10 Essential (primary) hypertension: Secondary | ICD-10-CM | POA: Diagnosis present

## 2015-02-05 DIAGNOSIS — F0390 Unspecified dementia without behavioral disturbance: Secondary | ICD-10-CM | POA: Diagnosis present

## 2015-02-05 DIAGNOSIS — R570 Cardiogenic shock: Secondary | ICD-10-CM | POA: Diagnosis not present

## 2015-02-05 DIAGNOSIS — R571 Hypovolemic shock: Secondary | ICD-10-CM

## 2015-02-05 DIAGNOSIS — Z880 Allergy status to penicillin: Secondary | ICD-10-CM

## 2015-02-05 DIAGNOSIS — T45525A Adverse effect of antithrombotic drugs, initial encounter: Secondary | ICD-10-CM | POA: Diagnosis present

## 2015-02-05 DIAGNOSIS — Z7902 Long term (current) use of antithrombotics/antiplatelets: Secondary | ICD-10-CM

## 2015-02-05 DIAGNOSIS — Z7901 Long term (current) use of anticoagulants: Secondary | ICD-10-CM | POA: Diagnosis not present

## 2015-02-05 DIAGNOSIS — D62 Acute posthemorrhagic anemia: Secondary | ICD-10-CM | POA: Diagnosis present

## 2015-02-05 DIAGNOSIS — E875 Hyperkalemia: Secondary | ICD-10-CM | POA: Diagnosis present

## 2015-02-05 DIAGNOSIS — R579 Shock, unspecified: Secondary | ICD-10-CM | POA: Diagnosis not present

## 2015-02-05 DIAGNOSIS — I469 Cardiac arrest, cause unspecified: Secondary | ICD-10-CM | POA: Diagnosis not present

## 2015-02-05 DIAGNOSIS — Y92129 Unspecified place in nursing home as the place of occurrence of the external cause: Secondary | ICD-10-CM

## 2015-02-05 DIAGNOSIS — M109 Gout, unspecified: Secondary | ICD-10-CM | POA: Diagnosis present

## 2015-02-05 DIAGNOSIS — K683 Retroperitoneal hematoma: Secondary | ICD-10-CM | POA: Diagnosis present

## 2015-02-05 DIAGNOSIS — Z515 Encounter for palliative care: Secondary | ICD-10-CM | POA: Diagnosis not present

## 2015-02-05 DIAGNOSIS — E43 Unspecified severe protein-calorie malnutrition: Secondary | ICD-10-CM | POA: Diagnosis present

## 2015-02-05 DIAGNOSIS — K661 Hemoperitoneum: Secondary | ICD-10-CM | POA: Diagnosis present

## 2015-02-05 DIAGNOSIS — I429 Cardiomyopathy, unspecified: Secondary | ICD-10-CM | POA: Diagnosis present

## 2015-02-05 DIAGNOSIS — R6521 Severe sepsis with septic shock: Secondary | ICD-10-CM

## 2015-02-05 DIAGNOSIS — I2510 Atherosclerotic heart disease of native coronary artery without angina pectoris: Secondary | ICD-10-CM | POA: Diagnosis present

## 2015-02-05 DIAGNOSIS — I959 Hypotension, unspecified: Secondary | ICD-10-CM | POA: Diagnosis not present

## 2015-02-05 DIAGNOSIS — Z7982 Long term (current) use of aspirin: Secondary | ICD-10-CM

## 2015-02-05 DIAGNOSIS — Z8673 Personal history of transient ischemic attack (TIA), and cerebral infarction without residual deficits: Secondary | ICD-10-CM

## 2015-02-05 DIAGNOSIS — E872 Acidosis: Secondary | ICD-10-CM | POA: Diagnosis present

## 2015-02-05 DIAGNOSIS — R109 Unspecified abdominal pain: Secondary | ICD-10-CM

## 2015-02-05 DIAGNOSIS — Z951 Presence of aortocoronary bypass graft: Secondary | ICD-10-CM

## 2015-02-05 DIAGNOSIS — E119 Type 2 diabetes mellitus without complications: Secondary | ICD-10-CM | POA: Diagnosis present

## 2015-02-05 DIAGNOSIS — R0600 Dyspnea, unspecified: Secondary | ICD-10-CM

## 2015-02-05 DIAGNOSIS — A419 Sepsis, unspecified organism: Secondary | ICD-10-CM | POA: Diagnosis not present

## 2015-02-05 DIAGNOSIS — I5021 Acute systolic (congestive) heart failure: Secondary | ICD-10-CM | POA: Diagnosis present

## 2015-02-05 DIAGNOSIS — Z87440 Personal history of urinary (tract) infections: Secondary | ICD-10-CM

## 2015-02-05 DIAGNOSIS — Z4659 Encounter for fitting and adjustment of other gastrointestinal appliance and device: Secondary | ICD-10-CM

## 2015-02-05 DIAGNOSIS — R531 Weakness: Secondary | ICD-10-CM

## 2015-02-05 DIAGNOSIS — R400 Somnolence: Secondary | ICD-10-CM | POA: Diagnosis not present

## 2015-02-05 DIAGNOSIS — Z452 Encounter for adjustment and management of vascular access device: Secondary | ICD-10-CM

## 2015-02-05 HISTORY — DX: Type 2 diabetes mellitus without complications: E11.9

## 2015-02-05 HISTORY — DX: Urinary tract infection, site not specified: N39.0

## 2015-02-05 HISTORY — DX: Gout, unspecified: M10.9

## 2015-02-05 HISTORY — DX: Essential (primary) hypertension: I10

## 2015-02-05 HISTORY — DX: Presence of aortocoronary bypass graft: Z95.1

## 2015-02-05 HISTORY — DX: Personal history of transient ischemic attack (TIA), and cerebral infarction without residual deficits: Z86.73

## 2015-02-05 HISTORY — DX: Unspecified dementia, unspecified severity, without behavioral disturbance, psychotic disturbance, mood disturbance, and anxiety: F03.90

## 2015-02-05 LAB — CBC WITH DIFFERENTIAL/PLATELET
BASOS ABS: 0 10*3/uL (ref 0–0.1)
Basophils Relative: 0 %
Eosinophils Absolute: 0 10*3/uL (ref 0–0.7)
Eosinophils Relative: 1 %
HEMATOCRIT: 28.2 % — AB (ref 35.0–47.0)
HEMOGLOBIN: 9.1 g/dL — AB (ref 12.0–16.0)
LYMPHS PCT: 10 %
Lymphs Abs: 0.8 10*3/uL — ABNORMAL LOW (ref 1.0–3.6)
MCH: 26.7 pg (ref 26.0–34.0)
MCHC: 32.3 g/dL (ref 32.0–36.0)
MCV: 82.7 fL (ref 80.0–100.0)
Monocytes Absolute: 0.3 10*3/uL (ref 0.2–0.9)
Monocytes Relative: 4 %
NEUTROS ABS: 7.6 10*3/uL — AB (ref 1.4–6.5)
NEUTROS PCT: 85 %
Platelets: 221 10*3/uL (ref 150–440)
RBC: 3.4 MIL/uL — AB (ref 3.80–5.20)
RDW: 16.2 % — ABNORMAL HIGH (ref 11.5–14.5)
WBC: 8.8 10*3/uL (ref 3.6–11.0)

## 2015-02-05 LAB — BLOOD GAS, ARTERIAL
ACID-BASE DEFICIT: 3.3 mmol/L — AB (ref 0.0–2.0)
Allens test (pass/fail): POSITIVE — AB
BICARBONATE: 22.1 meq/L (ref 21.0–28.0)
FIO2: 0.24
O2 SAT: 97.8 %
PCO2 ART: 40 mmHg (ref 32.0–48.0)
PO2 ART: 105 mmHg (ref 83.0–108.0)
Patient temperature: 37
pH, Arterial: 7.35 (ref 7.350–7.450)

## 2015-02-05 LAB — URINALYSIS COMPLETE WITH MICROSCOPIC (ARMC ONLY)
BACTERIA UA: NONE SEEN
Bilirubin Urine: NEGATIVE
Glucose, UA: NEGATIVE mg/dL
Hgb urine dipstick: NEGATIVE
KETONES UR: NEGATIVE mg/dL
Leukocytes, UA: NEGATIVE
NITRITE: NEGATIVE
PROTEIN: NEGATIVE mg/dL
Specific Gravity, Urine: 1.019 (ref 1.005–1.030)
pH: 5 (ref 5.0–8.0)

## 2015-02-05 LAB — COMPREHENSIVE METABOLIC PANEL
ALT: 36 U/L (ref 14–54)
AST: 51 U/L — AB (ref 15–41)
Albumin: 1.5 g/dL — ABNORMAL LOW (ref 3.5–5.0)
Alkaline Phosphatase: 80 U/L (ref 38–126)
Anion gap: 7 (ref 5–15)
BILIRUBIN TOTAL: 0.7 mg/dL (ref 0.3–1.2)
BUN: 63 mg/dL — AB (ref 6–20)
CO2: 23 mmol/L (ref 22–32)
CREATININE: 1.47 mg/dL — AB (ref 0.44–1.00)
Calcium: 7.8 mg/dL — ABNORMAL LOW (ref 8.9–10.3)
Chloride: 117 mmol/L — ABNORMAL HIGH (ref 101–111)
GFR calc Af Amer: 36 mL/min — ABNORMAL LOW (ref >60)
GFR, EST NON AFRICAN AMERICAN: 31 mL/min — AB (ref >60)
GLUCOSE: 98 mg/dL (ref 65–99)
Potassium: 5.1 mmol/L (ref 3.5–5.1)
Sodium: 147 mmol/L — ABNORMAL HIGH (ref 135–145)
TOTAL PROTEIN: 6.2 g/dL — AB (ref 6.5–8.1)

## 2015-02-05 LAB — LACTIC ACID, PLASMA
LACTIC ACID, VENOUS: 2.3 mmol/L — AB (ref 0.5–2.0)
LACTIC ACID, VENOUS: 2 mmol/L (ref 0.5–2.0)

## 2015-02-05 LAB — MRSA PCR SCREENING: MRSA by PCR: NEGATIVE

## 2015-02-05 LAB — TROPONIN I
TROPONIN I: 0.73 ng/mL — AB (ref <0.031)
TROPONIN I: 0.79 ng/mL — AB (ref <0.031)
Troponin I: 0.97 ng/mL — ABNORMAL HIGH (ref <0.031)

## 2015-02-05 LAB — GLUCOSE, CAPILLARY: GLUCOSE-CAPILLARY: 77 mg/dL (ref 65–99)

## 2015-02-05 MED ORDER — ACETAMINOPHEN 650 MG RE SUPP: 650.0000 mg | Freq: Four times a day (QID) | RECTAL | Status: DC | PRN

## 2015-02-05 MED ORDER — VANCOMYCIN HCL 500 MG IV SOLR
500.0000 mg | INTRAVENOUS | Status: DC
  Administered 2015-02-06: 500 mg via INTRAVENOUS
  Filled 2015-02-05 (×2): qty 500

## 2015-02-05 MED ORDER — HEPARIN SODIUM (PORCINE) 5000 UNIT/ML IJ SOLN
5000.0000 [IU] | Freq: Three times a day (TID) | INTRAMUSCULAR | Status: DC
Start: 2015-02-05 — End: 2015-02-06
  Administered 2015-02-05: 5000 [IU] via SUBCUTANEOUS
  Filled 2015-02-05: qty 1

## 2015-02-05 MED ORDER — SODIUM CHLORIDE 0.45 % IV SOLN
INTRAVENOUS | Status: DC
  Administered 2015-02-05 – 2015-02-06 (×2): via INTRAVENOUS

## 2015-02-05 MED ORDER — IOHEXOL 240 MG/ML SOLN: 25.0000 mL | INTRAMUSCULAR | Status: AC

## 2015-02-05 MED ORDER — ONDANSETRON HCL 4 MG/2ML IJ SOLN: 4.0000 mg | Freq: Four times a day (QID) | INTRAMUSCULAR | Status: DC | PRN

## 2015-02-05 MED ORDER — SODIUM CHLORIDE 0.9 % IV BOLUS (SEPSIS)
500.0000 mL | Freq: Once | INTRAVENOUS | Status: AC
  Administered 2015-02-05: 500 mL via INTRAVENOUS

## 2015-02-05 MED ORDER — ACETAMINOPHEN 325 MG PO TABS: 650.0000 mg | ORAL_TABLET | Freq: Four times a day (QID) | ORAL | Status: DC | PRN

## 2015-02-05 MED ORDER — ACETAMINOPHEN 650 MG RE SUPP: 650.0000 mg | RECTAL | Status: DC | PRN

## 2015-02-05 MED ORDER — NOREPINEPHRINE BITARTRATE 1 MG/ML IV SOLN: 0.0000 ug/min | INTRAVENOUS | Status: DC

## 2015-02-05 MED ORDER — VANCOMYCIN HCL 10 G IV SOLR
1250.0000 mg | Freq: Once | INTRAVENOUS | Status: DC
  Filled 2015-02-05: qty 1250

## 2015-02-05 MED ORDER — SODIUM CHLORIDE 0.9 % IV BOLUS (SEPSIS)
1000.0000 mL | Freq: Once | INTRAVENOUS | Status: AC
  Administered 2015-02-05: 1000 mL via INTRAVENOUS

## 2015-02-05 MED ORDER — NOREPINEPHRINE 4 MG/250ML-% IV SOLN
0.0000 ug/min | INTRAVENOUS | Status: DC
  Administered 2015-02-05: 5 ug/min via INTRAVENOUS
  Administered 2015-02-06 (×3): 30 ug/min via INTRAVENOUS
  Administered 2015-02-06: 30.133 ug/min via INTRAVENOUS
  Filled 2015-02-05 (×7): qty 250

## 2015-02-05 MED ORDER — ONDANSETRON HCL 4 MG PO TABS: 4.0000 mg | ORAL_TABLET | Freq: Four times a day (QID) | ORAL | Status: DC | PRN

## 2015-02-05 MED ORDER — SODIUM CHLORIDE 0.9 % IV SOLN
500.0000 mg | Freq: Two times a day (BID) | INTRAVENOUS | Status: DC
  Administered 2015-02-05 – 2015-02-07 (×4): 500 mg via INTRAVENOUS
  Filled 2015-02-05 (×6): qty 0.5

## 2015-02-05 NOTE — ED Notes (Signed)
Iv that was in right forearm on arrival to ED had infiltrated .IV was d/c'd and cath intact and site clear. Informed family that she needed another one and they requested for [place] to wait.

## 2015-02-05 NOTE — ED Notes (Signed)
Patient arrived by EMS from [facility]. PA spoke with family at facility and suggested hospice evaluation and family stated they are not ready to let their family member go so they wanted her to come to the hospital to be evaluated. According to staff patient has been steadily declining in health the past month.

## 2015-02-05 NOTE — ED Provider Notes (Addendum)
[facility] Emergency Department Provider Note  ____________________________________________  Time seen: Approximately 2:33 PM  I have reviewed the triage vital signs and the nursing notes.   HISTORY  Chief Complaint Altered Mental Status    HPI Stephanie Salas is a 81 y.o. female with a history of a DO NOT RESUSCITATE order, patient has multiple other medical problems, she has significant dementia and does not usually talk to people. She lives in a nursing facility. She has family at the bedside. They do not wish heroic measures for the patient. However, they do wish to make sure that she is comfortable and they are amenable to intravenous fluid and antibiotics. Patient is brought in for dehydration. Apparently, she is being treated at the nursing home for a urinary tract infection by IV antibiotics. The patient has not been taking by mouth for several days and the family is concerned about dehydration. The patient herself at baseline is nonverbal and cannot give a history.  Past Medical History  Diagnosis Date  . Hypertension   . Diabetes mellitus without complication     There are no active problems to display for this patient.   History reviewed. No pertinent past surgical history.  Current Outpatient Rx  Name  Route  Sig  Dispense  Refill  . acetaminophen (TYLENOL) 650 MG suppository   Rectal   Place 650 mg rectally every 4 (four) hours as needed.         Marland Kitchen albuterol (PROVENTIL) (2.5 MG/3ML) 0.083% nebulizer solution   Nebulization   Take 2.5 mg by nebulization every 2 (two) hours as needed for wheezing or shortness of breath.         . Amino Acids-Protein Hydrolys (FEEDING SUPPLEMENT, PRO-STAT SUGAR FREE 64,) LIQD   Oral   Take 30 mLs by mouth 2 (two) times daily.         Marland Kitchen amLODipine (NORVASC) 2.5 MG tablet   Oral   Take 2.5 mg by mouth daily.         Marland Kitchen aspirin EC 81 MG tablet   Oral   Take 81 mg by mouth daily.         .  clopidogrel (PLAVIX) 75 MG tablet   Oral   Take 75 mg by mouth daily.         . colchicine 0.6 MG tablet   Oral   Take 0.6 mg by mouth 4 (four) times daily.         Marland Kitchen donepezil (ARICEPT) 5 MG tablet   Oral   Take 5 mg by mouth at bedtime.         . ergocalciferol (VITAMIN D2) 50000 UNITS capsule   Oral   Take 50,000 Units by mouth every 30 (thirty) days.         . ertapenem (INVANZ) 1 G SOLR injection   Intramuscular   Inject 1 g into the muscle at bedtime.         Marland Kitchen lisinopril (PRINIVIL,ZESTRIL) 20 MG tablet   Oral   Take 20 mg by mouth daily.         . memantine (NAMENDA) 10 MG tablet   Oral   Take 10 mg by mouth 2 (two) times daily.         . metoprolol tartrate (LOPRESSOR) 25 MG tablet   Oral   Take 25 mg by mouth 2 (two) times daily.         . Multiple Vitamin (MULTIVITAMIN) tablet   Oral  Take 1 tablet by mouth daily.         . polyethylene glycol (MIRALAX / GLYCOLAX) packet   Oral   Take 17 g by mouth daily.         . potassium chloride in sodium chloride 0.45 % 1,000 mL   Intravenous   Inject 1 Dose into the vein continuous.         . potassium chloride SA (K-DUR,KLOR-CON) 20 MEQ tablet   Oral   Take 20 mEq by mouth daily.           Allergies Penicillins and Pravastatin  History reviewed. No pertinent family history.  Social History Social History  Substance Use Topics  . Smoking status: Never Smoker   . Smokeless tobacco: Never Used  . Alcohol Use: No    Review of Systems Cannot obtain second patient baseline and acute mental status 10-point ROS otherwise negative.  ____________________________________________   PHYSICAL EXAM:  VITAL SIGNS: ED Triage Vitals  Enc Vitals Group     BP 02-09-2015 1211 84/48 mmHg     Pulse Rate 02-09-15 1211 97     Resp February 09, 2015 1211 27     Temp February 09, 2015 1211 99 F (37.2 C)     Temp Source 09-Feb-2015 1211 Oral     SpO2 02-09-2015 1211 93 %     Weight 02/09/2015 1211 175 lb 14.4 oz  (79.788 kg)     Height --      Head Cir --      Peak Flow --      Pain Score --      Pain Loc --      Pain Edu? --      Excl. in GC? --     Constitutional: Sleeping but arousable in no acute distress Eyes: Conjunctivae are normal. PERRL. EOMI. Head: Atraumatic. Nose: No congestion/rhinnorhea. Mouth/Throat: Mucous membranes are quite dry.  Oropharynx non-erythematous. Neck: No stridor.   Cardiovascular: Normal rate, regular rhythm. Grossly normal heart sounds.  Good peripheral circulation. Respiratory: Normal respiratory effort.  No retractions. Lungs CTAB. Gastrointestinal: Soft and nontender. No distention. No abdominal bruits. No CVA tenderness. Musculoskeletal: No lower extremity tenderness nor edema.  No joint effusions. Neurologic:  Normal speech and language. No gross focal neurologic deficits are appreciated. No gait instability. Skin:  Skin is warm, dry and intact. No rash noted. .  ____________________________________________   LABS (all labs ordered are listed, but only abnormal results are displayed)  Labs Reviewed  LACTIC ACID, PLASMA - Abnormal; Notable for the following:    Lactic Acid, Venous 2.3 (*)    All other components within normal limits  TROPONIN I - Abnormal; Notable for the following:    Troponin I 0.97 (*)    All other components within normal limits  URINALYSIS COMPLETEWITH MICROSCOPIC ([facility] ONLY) - Abnormal; Notable for the following:    Color, Urine YELLOW (*)    APPearance HAZY (*)    Squamous Epithelial / LPF 0-5 (*)    All other components within normal limits  CBC WITH DIFFERENTIAL/PLATELET - Abnormal; Notable for the following:    RBC 3.40 (*)    Hemoglobin 9.1 (*)    HCT 28.2 (*)    RDW 16.2 (*)    Neutro Abs 7.6 (*)    Lymphs Abs 0.8 (*)    All other components within normal limits  URINE CULTURE  CULTURE, BLOOD (ROUTINE X 2)  CULTURE, BLOOD (ROUTINE X 2)  LACTIC ACID, PLASMA    ____________________________________________  EKG  I have personally reviewed this EKG, normal sinus rhythm rate 98 bpm, ST changes laterally noted. ____________________________________________  RADIOLOGY I have reviewed x-rays  ____________________________________________   PROCEDURES  Procedure(s) performed: None  Critical Care performed: None  ____________________________________________   INITIAL IMPRESSION / ASSESSMENT AND PLAN / ED COURSE  Pertinent labs & imaging results that were available during my care of the patient were reviewed by me and considered in my medical decision making (see chart for details).  Elderly woman who is quite frail presents today with decreased by mouth for actually now going on 2 weeks with dehydration and low blood pressure. She is currently getting IV antibiotics for urinary tract infection. We have given her IV fluids here and her blood pressure is trending up. Her IV was infiltrated and the family declined to allow [place] to try a more peripheral IVs in her upper extremities which limited our ability to emergently rehydrate the patient. Family was aware of the limitations of this placed upon [place]. We were able to find a peripheral IV in her foot after a failed EJ attempt. Central line was not attempted given patient status. We are giving her IV fluids. There is no obvious evidence of acute infection she is afebrile with no evidence of UTI at this time nor does she have evidence of a pneumonia. However, she certainly has significant dehydration clinically. Her troponin is elevated and again this could be a demand ischemia. She has some lateral T-wave changes but again the patient is not someone upon him a cardiac catheterization can be performed. The family does not wish this. Patient is getting IV antibiotics at the nursing home, and it is possible she has a partially treated urinary tract infection and a urine culture has been sent. Family aware of the  poor prognosis for this patient. ____________________________________________  ----------------------------------------- 3:24 PM on 02-15-2015 -----------------------------------------  Is no clear source of infection, patient is slightly more arousable at this time after IV fluids, we will continue hydrating her. I have explained to the family that this certainly could represent end-of-life and they are amenable it appears to palliative care consult. I discussed with the hospitalists admission for further hydration and they will see the patient they agree with management. I do not see a role for an otherwise this moment as she does not have a white count a fever or a source of infection but we will defer to the hospitalist further intervention in that regard.   FINAL CLINICAL IMPRESSION(S) / ED DIAGNOSES  Final diagnoses:  Weakness     Jeanmarie Plant, MD Feb 15, 2015 1438  Jeanmarie Plant, MD 02/15/2015 1525

## 2015-02-05 NOTE — Progress Notes (Signed)
ANTIBIOTIC CONSULT NOTE - INITIAL  Pharmacy Consult for Meropenem & Vancomycin Indication: sepsis  Allergies  Allergen Reactions  . Penicillins   . Pravastatin     Patient Measurements: Weight: 175 lb 14.4 oz (79.788 kg) Adjusted Body Weight: 79.8 kg  Vital Signs: Temp: 99 F (37.2 C) (09/16 1211) Temp Source: Oral (09/16 1211) BP: 82/51 mmHg (09/16 1500) Pulse Rate: 90 (09/16 1500) Intake/Output from previous day:   Intake/Output from this shift:    Labs:  Recent Labs  02/01/2015 1220  WBC 8.8  HGB 9.1*  PLT 221  CREATININE 1.47*   CrCl cannot be calculated (Unknown ideal weight.).   Estimate height at 54 inches, Est CrCl 20 to 25 ml per min.  Microbiology: No results found for this or any previous visit (from the past 720 hour(s)).  Medical History: See complete H&P.  Assessment: Possible abdominal infection  Goal of Therapy:  Vancomycin trough level 10-15 mcg/ml   Plan:  1 - Load with vancomycin  and start 500 mg every 24 hours 2 - Meropenem 500 mg IV every 12 hours   Sherilyn Cooter A Zompa 02/07/2015,4:15 PM

## 2015-02-05 NOTE — Consult Note (Signed)
[facility] Pulmonary Medicine Consultation      Name: Stephanie Salas MRN: [id number] DOB: Dec 23, 1923    ADMISSION DATE:  2015-02-07    CHIEF COMPLAINT:     Mental status changes and low blood pressure  HISTORY OF PRESENT ILLNESS    81 y.o. female with a known history of dementia, hypertension, history of coronary disease status post CABG, history of previous CVA, type 2 diabetes without complication, history of recurrent UTIs, who presented to the hospital due to altered mental status and noted to be hypotensive.  Patient apparently has been more lethargic and altered this past week as per the family. Patient herself is quite encephalopathic and lethargic and therefore most of the history obtained from the family at bedside. At baseline patient is able to feed herself at times, she is alert but wheelchair-bound.  She for the past week has been more lethargic and altered and her by mouth intake has been poor. She was possibly diagnosed with an infection but unclear source and started on ertapenem at the skilled nursing facility.  Patient was noted to be in shock with systolic blood pressures in the 70s and also encephalopathic and lethargic.  Family at bedside, central line placed for vasorpessor therapy, Patient is DNR/DNI       PAST MEDICAL HISTORY    :  Past Medical History  Diagnosis Date  . Hypertension   . Diabetes mellitus without complication   . Dementia   . Recurrent UTI   . Hx of CABG   . History of CVA (cerebrovascular accident)   . Gout    History reviewed. No pertinent past surgical history. Prior to Admission medications   Medication Sig Start Date End Date Taking? Authorizing Jahzaria Vary  acetaminophen (TYLENOL) 650 MG suppository Place 650 mg rectally every 4 (four) hours as needed.   Yes Historical Jazion Atteberry, MD  albuterol (PROVENTIL) (2.5 MG/3ML) 0.083% nebulizer solution Take 2.5 mg by nebulization every 2 (two) hours as needed for wheezing or  shortness of breath.   Yes Historical Iniya Matzek, MD  Amino Acids-Protein Hydrolys (FEEDING SUPPLEMENT, PRO-STAT SUGAR FREE 64,) LIQD Take 30 mLs by mouth 2 (two) times daily.   Yes Historical Cydne Grahn, MD  amLODipine (NORVASC) 2.5 MG tablet Take 2.5 mg by mouth daily.   Yes Historical Nashali Ditmer, MD  aspirin EC 81 MG tablet Take 81 mg by mouth daily.   Yes Historical Riyanshi Wahab, MD  clopidogrel (PLAVIX) 75 MG tablet Take 75 mg by mouth daily.   Yes Historical Lalita Ebel, MD  colchicine 0.6 MG tablet Take 0.6 mg by mouth 4 (four) times daily.   Yes Historical Denisha Hoel, MD  donepezil (ARICEPT) 5 MG tablet Take 5 mg by mouth at bedtime.   Yes Historical Carlus Stay, MD  ergocalciferol (VITAMIN D2) 50000 UNITS capsule Take 50,000 Units by mouth every 30 (thirty) days.   Yes Historical Eryk Beavers, MD  ertapenem [facility]) 1 G SOLR injection Inject 1 g into the muscle at bedtime. 02/03/15 07-Feb-2015 Yes Historical Tyrion Glaude, MD  lisinopril (PRINIVIL,ZESTRIL) 20 MG tablet Take 20 mg by mouth daily.   Yes Historical Sayuri Rhames, MD  memantine (NAMENDA) 10 MG tablet Take 10 mg by mouth 2 (two) times daily.   Yes Historical Heith Haigler, MD  metoprolol tartrate (LOPRESSOR) 25 MG tablet Take 25 mg by mouth 2 (two) times daily.   Yes Historical Johnathon Olden, MD  Multiple Vitamin (MULTIVITAMIN) tablet Take 1 tablet by mouth daily.   Yes Historical Jhon Mallozzi, MD  polyethylene glycol (MIRALAX / GLYCOLAX) packet Take  17 g by mouth daily.   Yes Historical Marek Nghiem, MD  potassium chloride in sodium chloride 0.45 % 1,000 mL Inject 1 Dose into the vein continuous.   Yes Historical Tarshia Kot, MD  potassium chloride SA (K-DUR,KLOR-CON) 20 MEQ tablet Take 20 mEq by mouth daily.   Yes Historical Davyon Fisch, MD   Allergies  Allergen Reactions  . Penicillins   . Pravastatin      FAMILY HISTORY   Family History  Problem Relation Age of Onset  . Prostate cancer Father       SOCIAL HISTORY    reports that she has never smoked. She has never  used smokeless tobacco. She reports that she does not drink alcohol. Her drug history is not on file.  Review of Systems  Unable to perform ROS: critical illness      VITAL SIGNS    Temp:  [97.9 F (36.6 C)-99 F (37.2 C)] 97.9 F (36.6 C) (09/16 1900) Pulse Rate:  [85-99] 85 (09/16 1838) Resp:  [18-29] 18 (09/16 1838) BP: (71-105)/(44-62) 79/48 mmHg (09/16 1900) SpO2:  [91 %-100 %] 100 % (09/16 1900) FiO2 (%):  [2 %] 2 % (09/16 1900) Weight:  [175 lb 14.4 oz (79.788 kg)] 175 lb 14.4 oz (79.788 kg) (09/16 1211) HEMODYNAMICS:   VENTILATOR SETTINGS: Vent Mode:  [-]  FiO2 (%):  [2 %] 2 % INTAKE / OUTPUT: No intake or output data in the 24 hours ending Feb 09, 2015 2025     PHYSICAL EXAM   Physical Exam  GENERAL:AAF, lying in the bed encephalopathic/lethargic and critically ill appearing.  EYES: Pupils equal, round, reactive to light and accommodation. No scleral icterus.  HEENT: Head atraumatic, normocephalic. Dry oral mucosa NECK: Supple,  LUNGS: Normal breath sounds bilaterally, no wheezing, rales, rhonchi. No use of accessory muscles of respiration.  CARDIOVASCULAR: S1, S2 RRR. No murmurs, ABDOMEN: Soft, tender diffusely, no rebound, rigidity. nondistended. Bowel sounds hypoactive.  EXTREMITIES: No pedal edema, cyanosis, or clubbing. + 2 pedal & radial pulses b/l.  NEUROLOGIC: Difficult to do a full neurological exam. Patient grimaces to pain. Globally weak. PSYCHIATRIC: Encephalopathic/lethargic. SKIN: No obvious rash, lesion, or ulcer.     LABS   LABS:  CBC  Recent Labs Lab 02/09/2015 1220  WBC 8.8  HGB 9.1*  HCT 28.2*  PLT 221   Coag's No results for input(s): APTT, INR in the last 168 hours. BMET  Recent Labs Lab February 09, 2015 1220  NA 147*  K 5.1  CL 117*  CO2 23  BUN 63*  CREATININE 1.47*  GLUCOSE 98   Electrolytes  Recent Labs Lab 09-Feb-2015 1220  CALCIUM 7.8*   Sepsis Markers  Recent Labs Lab 02/09/15 1247 February 09, 2015 1640    LATICACIDVEN 2.3* 2.0   ABG  Recent Labs Lab February 09, 2015 1630  PHART 7.35  PCO2ART 40  PO2ART 105   Liver Enzymes  Recent Labs Lab 02/09/15 1220  AST 51*  ALT 36  ALKPHOS 80  BILITOT 0.7  ALBUMIN 1.5*   Cardiac Enzymes  Recent Labs Lab 02/09/15 1220 2015/02/09 1640  TROPONINI 0.97* 0.73*   Glucose No results for input(s): GLUCAP in the last 168 hours.   No results found for this or any previous visit (from the past 240 hour(s)).   Current facility-administered medications:  .  0.45 % sodium chloride infusion, , Intravenous, Continuous, Houston Siren, MD .  acetaminophen (TYLENOL) tablet 650 mg, 650 mg, Oral, Q6H PRN **OR** acetaminophen (TYLENOL) suppository 650 mg, 650 mg, Rectal, Q6H PRN, Rolly Pancake  Sainani, MD .  heparin injection 5,000 Units, 5,000 Units, Subcutaneous, 3 times per day, Houston Siren, MD .  meropenem (MERREM) 500 mg in sodium chloride 0.9 % 50 mL IVPB, 500 mg, Intravenous, Q12H, Houston Siren, MD, Last Rate: 100 mL/hr at 02/16/2015 1813, 500 mg at 2015-02-16 1813 .  norepinephrine (LEVOPHED) 4mg  in D5W premix infusion, 0-40 mcg/min, Intravenous, Titrated, Houston Siren, MD .  ondansetron (ZOFRAN) tablet 4 mg, 4 mg, Oral, Q6H PRN **OR** ondansetron (ZOFRAN) injection 4 mg, 4 mg, Intravenous, Q6H PRN, Houston Siren, MD .  vancomycin (VANCOCIN) 1,250 mg in sodium chloride 0.9 % 250 mL IVPB, 1,250 mg, Intravenous, Once, Houston Siren, MD .  Melene Muller ON 02/06/2015] vancomycin (VANCOCIN) 500 mg in sodium chloride 0.9 % 100 mL IVPB, 500 mg, Intravenous, Q24H, Houston Siren, MD  IMAGING   CT abd shows RRETO hematomas     Indwelling Urinary Catheter continued, requirement due to   Reason to continue Indwelling Urinary Catheter for strict Intake/Output monitoring for hemodynamic instability   Central Line continued, requirement due to   Reason to continue [employer] Monitoring of central venous pressure or other hemodynamic parameters   MAJOR EVENTS/TEST RESULTS:  ICU admission 9/16 for shock CT abd pelvis notes intra abd hematoma   INDWELLING DEVICES:: Left IJ CVL>>9/16  MICRO DATA: MRSA PCR  Urine  Blood Resp   ANTIMICROBIALS:     ASSESSMENT/PLAN   81 yo AAF with acute mental status/encephlopathy with acute abd pain with acute hypovolumic shock from acute retro-pertioneal hematomas  PULMONARY Oxygen as needed  CARDIOVASCULAR -Hypovolumic shock-IVF's -follow h/h, transfuse as needed  RENAL arf from ATN -follow chem 7 Follow UO  GASTROINTESTINAL Acute retro perotoneal hematoma check coags -keep NPO for now   HEMATOLOGIC Follow h/h transfuses as needed  INFECTIOUS -no source at this time   ENDOCRINE -Follow fsbs  NEUROLOGIC encephlopathy from shock/acidosis -avoid sedatives     I have personally obtained a history, examined the patient, evaluated laboratory and independently reviewed  imaging results, formulated the assessment and plan and placed orders.  The Patient requires high complexity decision making for assessment and support, frequent evaluation and titration of therapies, application of advanced monitoring technologies and extensive interpretation of multiple databases. Critical Care Time devoted to patient care services described in this note is 45 minutes.   Overall, patient is critically ill, prognosis is guarded. Patient at high risk for cardiac arrest and death.    Lucie Leather, M.D.  [facility]  Medical Director [facility] Medical Director [facility] Cardio-Pulmonary Department

## 2015-02-05 NOTE — Procedures (Signed)
Central Venous Catheter Placement: Indication: Patient receiving vesicant or irritant drug.; Patient receiving intravenous therapy for longer than 5 days.; Patient has limited or no vascular access.   Consent:emergent  Risks and benefits explained in detail including risk of infection, bleeding, respiratory failure and death..   Hand washing performed prior to starting the procedure.   Procedure: An active timeout was performed and correct patient, name, & ID confirmed.  After explaining risk and benefits, patient was positioned correctly for central venous access. Patient was prepped using strict sterile technique including chlorohexadine preps, sterile drape, sterile gown and sterile gloves.  The area was prepped, draped and anesthetized in the usual sterile manner. Patient comfort was obtained.  A triple lumen catheter was placed in LEFT Internal Jugular Vein There was good blood return, catheter caps were placed on lumens, catheter flushed easily, the line was secured and a sterile dressing and BIO-PATCH applied.   Ultrasound was used to visualize vasculature and guidance of needle.   Number of Attempts: 1 Complications:none Estimated Blood Loss: none Chest Radiograph indicated and ordered.  Procedure time:10 mins Operator: Kasa.   Kurian David Kasa, M.D.  [facility]  Medical Director [facility] Medical Director [facility] Cardio-Pulmonary Department     

## 2015-02-05 NOTE — H&P (Signed)
[facility] [place] [facility]   PATIENT NAME: Stephanie Salas    MR#:  [id number]  DATE OF BIRTH:  12/18/1923  DATE OF ADMISSION:  23-Feb-2015  PRIMARY CARE PHYSICIAN: No primary care provider on file.   REQUESTING/REFERRING PHYSICIAN: Dr. Ileana Roup  CHIEF COMPLAINT:   Chief Complaint  Patient presents with  . Altered Mental Status    HISTORY OF PRESENT ILLNESS:  Stephanie Salas  is a 81 y.o. female with a known history of dementia, hypertension, history of coronary disease status post CABG, history of previous CVA, type 2 diabetes without complication, history of recurrent UTIs, who presented to the hospital due to altered mental status and noted to be hypotensive. Patient apparently has been more lethargic and altered this past week as per the family. Patient herself is quite encephalopathic and lethargic and therefore most of the history obtained from the family at bedside. At baseline patient is able to feed herself at times, she is alert but wheelchair-bound. She for the past week has been more lethargic and altered and her by mouth intake has been poor. She was possibly diagnosed with an infection but unclear source and started on ertapenem at the skilled nursing facility. She was not improving and therefore presented to the ER for further evaluation. Patient was noted to be in shock with systolic blood pressures in the 70s and also encephalopathic and lethargic. Patient is also noted to be in acute renal failure. Hospitalist services were contacted further treatment and evaluation.  PAST MEDICAL HISTORY:   Past Medical History  Diagnosis Date  . Hypertension   . Diabetes mellitus without complication   . Dementia   . Recurrent UTI   . Hx of CABG   . History of CVA (cerebrovascular accident)   . Gout     PAST SURGICAL HISTORY:  History reviewed. No pertinent past surgical history.  SOCIAL HISTORY:   Social History  Substance Use Topics   . Smoking status: Never Smoker   . Smokeless tobacco: Never Used  . Alcohol Use: No    FAMILY HISTORY:  History reviewed. No pertinent family history.  DRUG ALLERGIES:   Allergies  Allergen Reactions  . Penicillins   . Pravastatin     REVIEW OF SYSTEMS:   Review of Systems  Unable to perform ROS  due to encephalopathy  MEDICATIONS AT HOME:   Prior to Admission medications   Medication Sig Start Date End Date Taking? Authorizing Provider  acetaminophen (TYLENOL) 650 MG suppository Place 650 mg rectally every 4 (four) hours as needed.   Yes Historical Provider, MD  albuterol (PROVENTIL) (2.5 MG/3ML) 0.083% nebulizer solution Take 2.5 mg by nebulization every 2 (two) hours as needed for wheezing or shortness of breath.   Yes Historical Provider, MD  Amino Acids-Protein Hydrolys (FEEDING SUPPLEMENT, PRO-STAT SUGAR FREE 64,) LIQD Take 30 mLs by mouth 2 (two) times daily.   Yes Historical Provider, MD  amLODipine (NORVASC) 2.5 MG tablet Take 2.5 mg by mouth daily.   Yes Historical Provider, MD  aspirin EC 81 MG tablet Take 81 mg by mouth daily.   Yes Historical Provider, MD  clopidogrel (PLAVIX) 75 MG tablet Take 75 mg by mouth daily.   Yes Historical Provider, MD  colchicine 0.6 MG tablet Take 0.6 mg by mouth 4 (four) times daily.   Yes Historical Provider, MD  donepezil (ARICEPT) 5 MG tablet Take 5 mg by mouth at bedtime.   Yes Historical Provider, MD  ergocalciferol (VITAMIN  D2) 50000 UNITS capsule Take 50,000 Units by mouth every 30 (thirty) days.   Yes Historical Provider, MD  ertapenem [facility]) 1 G SOLR injection Inject 1 g into the muscle at bedtime. 02/03/15 26-Feb-2015 Yes Historical Provider, MD  lisinopril (PRINIVIL,ZESTRIL) 20 MG tablet Take 20 mg by mouth daily.   Yes Historical Provider, MD  memantine (NAMENDA) 10 MG tablet Take 10 mg by mouth 2 (two) times daily.   Yes Historical Provider, MD  metoprolol tartrate (LOPRESSOR) 25 MG tablet Take 25 mg by mouth 2 (two) times  daily.   Yes Historical Provider, MD  Multiple Vitamin (MULTIVITAMIN) tablet Take 1 tablet by mouth daily.   Yes Historical Provider, MD  polyethylene glycol (MIRALAX / GLYCOLAX) packet Take 17 g by mouth daily.   Yes Historical Provider, MD  potassium chloride in sodium chloride 0.45 % 1,000 mL Inject 1 Dose into the vein continuous.   Yes Historical Provider, MD  potassium chloride SA (K-DUR,KLOR-CON) 20 MEQ tablet Take 20 mEq by mouth daily.   Yes Historical Provider, MD      VITAL SIGNS:  Blood pressure 82/51, pulse 90, temperature 99 F (37.2 C), temperature source Oral, resp. rate 24, weight 79.788 kg (175 lb 14.4 oz), SpO2 100 %.  PHYSICAL EXAMINATION:  Physical Exam  GENERAL:  81 y.o.-year-old patient lying in the bed encephalopathic/lethargic and critically ill appearing.  EYES: Pupils equal, round, reactive to light and accommodation. No scleral icterus. Extraocular muscles intact.  HEENT: Head atraumatic, normocephalic. Oropharynx and nasopharynx clear. No oropharyngeal erythema, moist oral mucosa  NECK:  Supple, no jugular venous distention. No thyroid enlargement, no tenderness.  LUNGS: Normal breath sounds bilaterally, no wheezing, rales, rhonchi. No use of accessory muscles of respiration.  CARDIOVASCULAR: S1, S2 RRR. No murmurs, rubs, gallops, clicks.  ABDOMEN: Soft, tender diffusely, no rebound, rigidity. nondistended. Bowel sounds hypoactive. No organomegaly or mass.  EXTREMITIES: No pedal edema, cyanosis, or clubbing. + 2 pedal & radial pulses b/l.   NEUROLOGIC: Difficult to do a full neurological exam. Patient grimaces to pain. Globally weak. PSYCHIATRIC: Encephalopathic/lethargic. SKIN: No obvious rash, lesion, or ulcer.   LABORATORY PANEL:   CBC  Recent Labs Lab 26-Feb-2015 1220  WBC 8.8  HGB 9.1*  HCT 28.2*  PLT 221   ------------------------------------------------------------------------------------------------------------------  Chemistries   Recent  Labs Lab 2015-02-26 1220  NA 147*  K 5.1  CL 117*  CO2 23  GLUCOSE 98  BUN 63*  CREATININE 1.47*  CALCIUM 7.8*  AST 51*  ALT 36  ALKPHOS 80  BILITOT 0.7   ------------------------------------------------------------------------------------------------------------------  Cardiac Enzymes  Recent Labs Lab 2015-02-26 1220  TROPONINI 0.97*   ------------------------------------------------------------------------------------------------------------------  RADIOLOGY:  Dg Chest Port 1 View  February 26, 2015   CLINICAL DATA:  Weakness.  EXAM: PORTABLE CHEST - 1 VIEW  COMPARISON:  January 02, 2012.  FINDINGS: Stable cardiomediastinal silhouette. Status post coronary artery bypass graft. No pneumothorax or pleural effusion is noted. Left lung is clear. Minimal right midlung subsegmental atelectasis is noted. Bony thorax is intact.  IMPRESSION: Minimal right midlung subsegmental atelectasis.   Electronically Signed   By: Lupita Raider, M.D.   On: February 26, 2015 13:01     IMPRESSION AND PLAN:   81 year old female with past medical history of dementia, history of coronary disease status post CABG, history of previous CVA, history of previous UTI, hypertension, diabetes, who presents to the hospital due to altered mental status and noted to be in shock.  #1 shock-etiology unclear but suspected to be a  combination of septic/hypovolemic shock. -I will aggressively hydrate the patient with IV fluids, also start broad-spectrum IV antibiotics with vancomycin, meropenem. -Follow hemodynamics, if needed will start Levophed. Keep map greater than 60 and systolic blood pressure greater than 90. -Follow blood, urine cultures. Patient's chest x-ray and urinalysis are negative.  #2 acute renal failure-likely ATN secondary to hypotension. -Continue aggressive IV fluids, follow BUN and creatinine and urine output. -Hold nephrotoxins and antihypertensives presently.  #3 hyperkalemia-this is likely secondary to  acute renal failure and ATN. -There are no EKG changes consistent with hyperkalemia. Should improve as her renal function corrects.  #4 hypernatremia-likely secondary dehydration. Continue half-normal saline and follow sodium.  #5 abdominal pain-patient grimaces on palpation of her abdomen. She is here with hypotension and suspected septic shock. The source could be intra-abdominal. I will get a CT scan of the abdomen and pelvis noncontrast.  #6 elevated troponin-likely similar to demand ischemia from sepsis combined with acute renal failure. -I will cycle her cardiac markers. If needed consider a two-dimensional echocardiogram.   #7 history of dementia-hold oral meds and she is lethargic and encephalopathic.  #8 history of previous CVA-hold Plavix as patient is presently encephalopathic and will resume that once her mental status improves.  Patient is critically ill and discuss goals of care with patient's granddaughters/daughter and they want to pursue aggressive care other than life saving measures including intubation, mechanical ventilation, CPR.  We'll get palliative care consult to discuss goals of care.  All the records are reviewed and case discussed with ED provider. Management plans discussed with the patient, family and they are in agreement.  CODE STATUS: DO NOT RESUSCITATE  TOTAL CRITICAL CARE TIME TAKING CARE OF THIS PATIENT: 50 minutes.    Houston Siren M.D on 2015/02/17 at 3:54 PM  Between 7am to 6pm - Pager - [phone]  After 6pm go to [email or web address] - password [email or web address] [facility]  [place]  Office  [phone]  CC: Primary care physician; No primary care provider on file.

## 2015-02-06 DIAGNOSIS — R6521 Severe sepsis with septic shock: Secondary | ICD-10-CM

## 2015-02-06 DIAGNOSIS — K661 Hemoperitoneum: Secondary | ICD-10-CM | POA: Diagnosis present

## 2015-02-06 DIAGNOSIS — A419 Sepsis, unspecified organism: Secondary | ICD-10-CM

## 2015-02-06 DIAGNOSIS — L8990 Pressure ulcer of unspecified site, unspecified stage: Secondary | ICD-10-CM | POA: Insufficient documentation

## 2015-02-06 LAB — CBC
HEMATOCRIT: 28 % — AB (ref 35.0–47.0)
HEMATOCRIT: 24.3 % — AB (ref 35.0–47.0)
HEMOGLOBIN: 9 g/dL — AB (ref 12.0–16.0)
Hemoglobin: 7.7 g/dL — ABNORMAL LOW (ref 12.0–16.0)
MCH: 27.1 pg (ref 26.0–34.0)
MCH: 26.4 pg (ref 26.0–34.0)
MCHC: 32 g/dL (ref 32.0–36.0)
MCHC: 31.5 g/dL — AB (ref 32.0–36.0)
MCV: 84.7 fL (ref 80.0–100.0)
MCV: 83.5 fL (ref 80.0–100.0)
PLATELETS: 212 10*3/uL (ref 150–440)
PLATELETS: 175 10*3/uL (ref 150–440)
RBC: 3.3 MIL/uL — AB (ref 3.80–5.20)
RBC: 2.91 MIL/uL — ABNORMAL LOW (ref 3.80–5.20)
RDW: 16.4 % — ABNORMAL HIGH (ref 11.5–14.5)
RDW: 16.6 % — AB (ref 11.5–14.5)
WBC: 9.1 10*3/uL (ref 3.6–11.0)
WBC: 8.6 10*3/uL (ref 3.6–11.0)

## 2015-02-06 LAB — LACTIC ACID, PLASMA: Lactic Acid, Venous: 1.4 mmol/L (ref 0.5–2.0)

## 2015-02-06 LAB — BASIC METABOLIC PANEL
ANION GAP: 7 (ref 5–15)
Anion gap: 8 (ref 5–15)
BUN: 64 mg/dL — ABNORMAL HIGH (ref 6–20)
BUN: 63 mg/dL — AB (ref 6–20)
CALCIUM: 7.6 mg/dL — AB (ref 8.9–10.3)
CHLORIDE: 118 mmol/L — AB (ref 101–111)
CO2: 21 mmol/L — ABNORMAL LOW (ref 22–32)
CO2: 22 mmol/L (ref 22–32)
CREATININE: 1.17 mg/dL — AB (ref 0.44–1.00)
Calcium: 7.5 mg/dL — ABNORMAL LOW (ref 8.9–10.3)
Chloride: 110 mmol/L (ref 101–111)
Creatinine, Ser: 1.39 mg/dL — ABNORMAL HIGH (ref 0.44–1.00)
GFR calc Af Amer: 47 mL/min — ABNORMAL LOW (ref >60)
GFR calc non Af Amer: 33 mL/min — ABNORMAL LOW (ref >60)
GFR, EST AFRICAN AMERICAN: 38 mL/min — AB (ref >60)
GFR, EST NON AFRICAN AMERICAN: 40 mL/min — AB (ref >60)
GLUCOSE: 277 mg/dL — AB (ref 65–99)
Glucose, Bld: 165 mg/dL — ABNORMAL HIGH (ref 65–99)
POTASSIUM: 5.1 mmol/L (ref 3.5–5.1)
Potassium: 4.6 mmol/L (ref 3.5–5.1)
SODIUM: 147 mmol/L — AB (ref 135–145)
Sodium: 139 mmol/L (ref 135–145)

## 2015-02-06 LAB — PROTIME-INR
INR: 1.32
Prothrombin Time: 16.6 seconds — ABNORMAL HIGH (ref 11.4–15.0)

## 2015-02-06 LAB — ABO/RH: ABO/RH(D): A POS

## 2015-02-06 LAB — TROPONIN I
TROPONIN I: 0.23 ng/mL — AB (ref <0.031)
Troponin I: 0.51 ng/mL — ABNORMAL HIGH (ref <0.031)
Troponin I: 0.33 ng/mL — ABNORMAL HIGH (ref <0.031)

## 2015-02-06 LAB — PREPARE RBC (CROSSMATCH)

## 2015-02-06 MED ORDER — SODIUM CHLORIDE 0.9 % IV SOLN
Freq: Once | INTRAVENOUS | Status: AC
  Administered 2015-02-06: 18:00:00 via INTRAVENOUS

## 2015-02-06 MED ORDER — DEXTROSE 5 % IV SOLN
0.0000 ug/min | INTRAVENOUS | Status: DC
  Administered 2015-02-06: 30 ug/min via INTRAVENOUS
  Administered 2015-02-07: 35 ug/min via INTRAVENOUS
  Administered 2015-02-07: 26.667 ug/min via INTRAVENOUS
  Administered 2015-02-07: 27.5 ug/min via INTRAVENOUS
  Administered 2015-02-08: 26.5 ug/min via INTRAVENOUS
  Administered 2015-02-09 – 2015-02-10 (×2): 12 ug/min via INTRAVENOUS
  Filled 2015-02-06 (×8): qty 16

## 2015-02-06 MED ORDER — NOREPINEPHRINE BITARTRATE 1 MG/ML IV SOLN: 8.0000 ug/min | INTRAVENOUS | Status: DC

## 2015-02-06 MED ORDER — NOREPINEPHRINE BITARTRATE 1 MG/ML IV SOLN: 0.0000 ug/min | INTRAVENOUS | Status: DC

## 2015-02-06 MED ORDER — CHLORHEXIDINE GLUCONATE 0.12 % MT SOLN
15.0000 mL | Freq: Two times a day (BID) | OROMUCOSAL | Status: DC
  Administered 2015-02-06 – 2015-02-10 (×8): 15 mL via OROMUCOSAL
  Filled 2015-02-06 (×2): qty 15

## 2015-02-06 MED ORDER — CETYLPYRIDINIUM CHLORIDE 0.05 % MT LIQD
7.0000 mL | Freq: Two times a day (BID) | OROMUCOSAL | Status: DC
  Administered 2015-02-06 – 2015-02-10 (×9): 7 mL via OROMUCOSAL

## 2015-02-06 NOTE — Progress Notes (Signed)
ANTIBIOTIC CONSULT NOTE - Follow up  Pharmacy Consult for Meropenem & Vancomycin Indication: sepsis  Allergies  Allergen Reactions  . Penicillins   . Pravastatin     Patient Measurements: Height:  (167.6 cm) Weight: 158 lb 4.6 oz (71.8 kg) IBW/kg (Calculated) : 59.3 Adjusted Body Weight: 79.8 kg  Vital Signs: Temp: 98.6 F (37 C) (09/17 1100) Temp Source: Axillary (09/17 1100) BP: 110/47 mmHg (09/17 1330) Pulse Rate: 105 (09/17 1330)  Labs:  Recent Labs  02/06/2015 1220 02/06/15 0242  WBC 8.8 9.1  HGB 9.1* 9.0*  PLT 221 212  CREATININE 1.47* 1.39*   Estimated Creatinine Clearance: 28.4 mL/min (by C-G formula based on Cr of 1.39).   Estimate height at 54 inches, Est CrCl 20 to 25 ml per min.  Microbiology: Recent Results (from the past 720 hour(s))  Urine culture     Status: None (Preliminary result)   Collection Time: 01/24/2015 12:47 PM  Result Value Ref Range Status   Specimen Description URINE, CLEAN CATCH  Final   Special Requests Normal  Final   Culture NO GROWTH < 24 HOURS  Final   Report Status PENDING  Incomplete  Culture, blood (routine x 2)     Status: None (Preliminary result)   Collection Time: 02/07/2015 12:47 PM  Result Value Ref Range Status   Specimen Description BLOOD RIGHT HAND  Final   Special Requests BOTTLES DRAWN AEROBIC AND ANAEROBIC  3CC  Final   Culture NO GROWTH < 24 HOURS  Final   Report Status PENDING  Incomplete  Culture, blood (routine x 2)     Status: None (Preliminary result)   Collection Time: 02/14/2015  1:19 PM  Result Value Ref Range Status   Specimen Description BLOOD LEFT HAND  Final   Special Requests   Final    BOTTLES DRAWN AEROBIC AND ANAEROBIC 1CC AEROBIC,1CC ANAEROBIC   Culture NO GROWTH < 24 HOURS  Final   Report Status PENDING  Incomplete  MRSA PCR Screening     Status: None   Collection Time: 01/31/2015  8:39 PM  Result Value Ref Range Status   MRSA by PCR NEGATIVE NEGATIVE Final    Comment:        The  GeneXpert MRSA Assay (FDA approved for NASAL specimens only), is one component of a comprehensive MRSA colonization surveillance program. It is not intended to diagnose MRSA infection nor to guide or monitor treatment for MRSA infections.     Medical History: See complete H&P.  Assessment: Possible abdominal infection/sepsis  Goal of Therapy:  Vancomycin trough 15-20 mcg/ml  Plan:  1 - Load with vancomycin  and start 500 mg every 24 hours 2 - Meropenem 500 mg IV every 12 hours  9/17: Per MD note: Initial concern was for sepsis. Blood cultures have been drawn but are less than 24 hours. Continue broad-spectrum antibiotics until 24 hours negative. Low suspicion for sepsis. Will continue current regimen.  Bari Mantis PharmD Clinical Pharmacist 02/06/2015

## 2015-02-06 NOTE — Progress Notes (Signed)
Patient on levophed drip at this time - will tranfuse 1 unit of blood fue to hgb 7.7- patient NSR- foley in place- urine output minimal- Dr. Clent Ridges made aware- Nephrology consult placed.

## 2015-02-06 NOTE — Consult Note (Signed)
[facility] [place] [facility] Consultation      Name: Stephanie Salas MRN: [id number] DOB: 08-22-1923    ADMISSION DATE:  2015/02/14    CHIEF COMPLAINT:     Mental status changes and low blood pressure  HISTORY OF PRESENT ILLNESS    Remains  encephalopathic and lethargic. On levaphed infusion, check CVP and assess for IVF  central line placed for vasorpessor therapy, Patient is DNR/DNI       PAST MEDICAL HISTORY    :  Past Medical History  Diagnosis Date  . Hypertension   . Diabetes mellitus without complication   . Dementia   . Recurrent UTI   . Hx of CABG   . History of CVA (cerebrovascular accident)   . Gout    History reviewed. No pertinent past surgical history. Prior to Admission medications   Medication Sig Start Date End Date Taking? Authorizing Provider  acetaminophen (TYLENOL) 650 MG suppository Place 650 mg rectally every 4 (four) hours as needed.   Yes Historical Provider, MD  albuterol (PROVENTIL) (2.5 MG/3ML) 0.083% nebulizer solution Take 2.5 mg by nebulization every 2 (two) hours as needed for wheezing or shortness of breath.   Yes Historical Provider, MD  Amino Acids-Protein Hydrolys (FEEDING SUPPLEMENT, PRO-STAT SUGAR FREE 64,) LIQD Take 30 mLs by mouth 2 (two) times daily.   Yes Historical Provider, MD  amLODipine (NORVASC) 2.5 MG tablet Take 2.5 mg by mouth daily.   Yes Historical Provider, MD  aspirin EC 81 MG tablet Take 81 mg by mouth daily.   Yes Historical Provider, MD  clopidogrel (PLAVIX) 75 MG tablet Take 75 mg by mouth daily.   Yes Historical Provider, MD  colchicine 0.6 MG tablet Take 0.6 mg by mouth 4 (four) times daily.   Yes Historical Provider, MD  donepezil (ARICEPT) 5 MG tablet Take 5 mg by mouth at bedtime.   Yes Historical Provider, MD  ergocalciferol (VITAMIN D2) 50000 UNITS capsule Take 50,000 Units by mouth every 30 (thirty) days.   Yes Historical Provider, MD  ertapenem [facility]) 1 G SOLR injection Inject 1 g into the  muscle at bedtime. 02/03/15 02-14-15 Yes Historical Provider, MD  lisinopril (PRINIVIL,ZESTRIL) 20 MG tablet Take 20 mg by mouth daily.   Yes Historical Provider, MD  memantine (NAMENDA) 10 MG tablet Take 10 mg by mouth 2 (two) times daily.   Yes Historical Provider, MD  metoprolol tartrate (LOPRESSOR) 25 MG tablet Take 25 mg by mouth 2 (two) times daily.   Yes Historical Provider, MD  Multiple Vitamin (MULTIVITAMIN) tablet Take 1 tablet by mouth daily.   Yes Historical Provider, MD  polyethylene glycol (MIRALAX / GLYCOLAX) packet Take 17 g by mouth daily.   Yes Historical Provider, MD  potassium chloride in sodium chloride 0.45 % 1,000 mL Inject 1 Dose into the vein continuous.   Yes Historical Provider, MD  potassium chloride SA (K-DUR,KLOR-CON) 20 MEQ tablet Take 20 mEq by mouth daily.   Yes Historical Provider, MD   Allergies  Allergen Reactions  . Penicillins   . Pravastatin      FAMILY HISTORY   Family History  Problem Relation Age of Onset  . Prostate cancer Father       SOCIAL HISTORY    reports that she has never smoked. She has never used smokeless tobacco. She reports that she does not drink alcohol. Her drug history is not on file.  Review of Systems  Unable to perform ROS: critical illness  VITAL SIGNS    Temp:  [97.3 F (36.3 C)-99 F (37.2 C)] 97.3 F (36.3 C) (09/17 0400) Pulse Rate:  [38-106] 100 (09/17 0700) Resp:  [17-40] 19 (09/17 0700) BP: (66-114)/(37-62) 94/46 mmHg (09/17 0700) SpO2:  [88 %-100 %] 98 % (09/17 0700) FiO2 (%):  [2 %] 2 % (09/16 1900) Weight:  [175 lb 14.4 oz (79.788 kg)] 175 lb 14.4 oz (79.788 kg) (09/16 1211) HEMODYNAMICS: CVP:  [7 mmHg-12 mmHg] 12 mmHg VENTILATOR SETTINGS: Vent Mode:  [-]  FiO2 (%):  [2 %] 2 % INTAKE / OUTPUT:  Intake/Output Summary (Last 24 hours) at 02/06/15 0816 Last data filed at 02/06/15 0100  Gross per 24 hour  Intake 460.75 ml  Output    400 ml  Net  60.75 ml       PHYSICAL EXAM    Physical Exam  GENERAL:AAF, lying in the bed encephalopathic/lethargic and critically ill appearing.  EYES: Pupils equal, round, reactive to light and accommodation. No scleral icterus.  HEENT: Head atraumatic, normocephalic. Dry oral mucosa NECK: Supple,  LUNGS: Normal breath sounds bilaterally, no wheezing, rales, rhonchi. No use of accessory muscles of respiration.  CARDIOVASCULAR: S1, S2 RRR. No murmurs, ABDOMEN: Soft, tender diffusely, no rebound, rigidity. nondistended. Bowel sounds hypoactive.  EXTREMITIES: No pedal edema, cyanosis, or clubbing. + 2 pedal & radial pulses b/l.  NEUROLOGIC: Difficult to do a full neurological exam. Patient grimaces to pain. Globally weak. PSYCHIATRIC: Encephalopathic/lethargic. SKIN: No obvious rash, lesion, or ulcer.     LABS   LABS:  CBC  Recent Labs Lab 02-07-2015 1220 02/06/15 0242  WBC 8.8 9.1  HGB 9.1* 9.0*  HCT 28.2* 28.0*  PLT 221 212   Coag's No results for input(s): APTT, INR in the last 168 hours. BMET  Recent Labs Lab 02-07-2015 1220 02/06/15 0242  NA 147* 147*  K 5.1 5.1  CL 117* 118*  CO2 23 21*  BUN 63* 64*  CREATININE 1.47* 1.39*  GLUCOSE 98 165*   Electrolytes  Recent Labs Lab 2015-02-07 1220 02/06/15 0242  CALCIUM 7.8* 7.5*   Sepsis Markers  Recent Labs Lab 02-07-2015 1247 2015/02/07 1640 02/07/15 2228  LATICACIDVEN 2.3* 2.0 1.4   ABG  Recent Labs Lab 2015/02/07 1630  PHART 7.35  PCO2ART 40  PO2ART 105   Liver Enzymes  Recent Labs Lab 2015/02/07 1220  AST 51*  ALT 36  ALKPHOS 80  BILITOT 0.7  ALBUMIN 1.5*   Cardiac Enzymes  Recent Labs Lab 02/07/15 1640 February 07, 2015 2139 02/06/15 0242  TROPONINI 0.73* 0.79* 0.51*   Glucose  Recent Labs Lab 02-07-2015 2103  GLUCAP 77     Recent Results (from the past 240 hour(s))  Culture, blood (routine x 2)     Status: None (Preliminary result)   Collection Time: 02/07/2015 12:47 PM  Result Value Ref Range Status   Specimen  Description BLOOD RIGHT HAND  Final   Special Requests BOTTLES DRAWN AEROBIC AND ANAEROBIC  3CC  Final   Culture NO GROWTH < 24 HOURS  Final   Report Status PENDING  Incomplete  Culture, blood (routine x 2)     Status: None (Preliminary result)   Collection Time: 02-07-15  1:19 PM  Result Value Ref Range Status   Specimen Description BLOOD LEFT HAND  Final   Special Requests   Final    BOTTLES DRAWN AEROBIC AND ANAEROBIC 1CC AEROBIC,1CC ANAEROBIC   Culture NO GROWTH < 24 HOURS  Final   Report Status PENDING  Incomplete  MRSA  PCR Screening     Status: None   Collection Time: 01/31/2015  8:39 PM  Result Value Ref Range Status   MRSA by PCR NEGATIVE NEGATIVE Final    Comment:        The GeneXpert MRSA Assay (FDA approved for NASAL specimens only), is one component of a comprehensive MRSA colonization surveillance program. It is not intended to diagnose MRSA infection nor to guide or monitor treatment for MRSA infections.      Current facility-administered medications:  .  0.45 % sodium chloride infusion, , Intravenous, Continuous, Houston Siren, MD, Last Rate: 125 mL/hr at 02/13/2015 2122 .  acetaminophen (TYLENOL) tablet 650 mg, 650 mg, Oral, Q6H PRN **OR** acetaminophen (TYLENOL) suppository 650 mg, 650 mg, Rectal, Q6H PRN, Houston Siren, MD .  heparin injection 5,000 Units, 5,000 Units, Subcutaneous, 3 times per day, Houston Siren, MD, 5,000 Units at 02/07/2015 2329 .  meropenem (MERREM) 500 mg in sodium chloride 0.9 % 50 mL IVPB, 500 mg, Intravenous, Q12H, Houston Siren, MD, Last Rate: 100 mL/hr at 02/06/15 0547, 500 mg at 02/06/15 0547 .  norepinephrine (LEVOPHED) 4mg  in D5W premix infusion, 0-40 mcg/min, Intravenous, Titrated, Houston Siren, MD, Last Rate: 112.5 mL/hr at 02/06/15 0236, 30 mcg/min at 02/06/15 0236 .  ondansetron (ZOFRAN) tablet 4 mg, 4 mg, Oral, Q6H PRN **OR** ondansetron (ZOFRAN) injection 4 mg, 4 mg, Intravenous, Q6H PRN, Houston Siren, MD .   vancomycin (VANCOCIN) 1,250 mg in sodium chloride 0.9 % 250 mL IVPB, 1,250 mg, Intravenous, Once, Houston Siren, MD .  vancomycin (VANCOCIN) 500 mg in sodium chloride 0.9 % 100 mL IVPB, 500 mg, Intravenous, Q24H, Houston Siren, MD  IMAGING   CT abd shows RETRO PERITONEAL hematomas     Indwelling Urinary Catheter continued, requirement due to   Reason to continue Indwelling Urinary Catheter for strict Intake/Output monitoring for hemodynamic instability   Central Line continued, requirement due to   Reason to continue [employer] Monitoring of central venous pressure or other hemodynamic parameters  MAJOR EVENTS/TEST RESULTS:  ICU admission 9/16 for shock CT abd pelvis notes intra abd hematoma   INDWELLING DEVICES:: Left IJ CVL>>9/16  MICRO DATA: MRSA PCR  Urine  Blood Resp   ANTIMICROBIALS:     ASSESSMENT/PLAN   81 yo AAF with acute mental status/encephlopathy with acute abd pain with acute hypovolumic shock from acute retro-pertioneal hematomas  PULMONARY Oxygen as needed  CARDIOVASCULAR -Hypovolumic shock-IVF's, check CVP -follow h/h, transfuse as needed  RENAL -ARF from ATN -follow chem 7 Follow UO  GASTROINTESTINAL Acute retro perotoneal hematoma check coags -keep NPO for now   HEMATOLOGIC Follow h/h transfuse as needed  INFECTIOUS -no source at this time   ENDOCRINE -Follow fsbs  NEUROLOGIC encephlopathy from shock/acidosis -avoid sedatives     I have personally obtained a history, examined the patient, evaluated laboratory and independently reviewed  imaging results, formulated the assessment and plan and placed orders.  The Patient requires high complexity decision making for assessment and support, frequent evaluation and titration of therapies, application of advanced monitoring technologies and extensive interpretation of multiple databases. Critical Care Time devoted to patient care services described in this note is 35  minutes.   Overall, patient is critically ill, prognosis is guarded. Patient at high risk for cardiac arrest and death.    Lucie Leather, M.D.  [facility]  Medical Director [facility] Medical Director [facility] Cardio-Pulmonary Department

## 2015-02-06 NOTE — Progress Notes (Signed)
Initial Nutrition Assessment     INTERVENTION:  Coordination of care: Await poc regarding nutrition   NUTRITION DIAGNOSIS:   Inadequate oral intake related to altered GI function as evidenced by NPO status.    GOAL:   Patient will meet greater than or equal to 90% of their needs    MONITOR:    (Energy intake, Digestive system)  REASON FOR ASSESSMENT:   Other (Comment) (pressure ulcer stage II)    ASSESSMENT:      Pt admitted with AMS, acute abdominal pain with hypovolemic shock, acute retroperitoneal hematomas   Current Nutrition: NPO  Food/Nutrition-Related History: pt unable to provide history at this time, no family at bedside   Medications: 1/2 NS at 112ml/hr, levophed  Electrolyte/Renal Profile and Glucose Profile:   Recent Labs Lab 03-02-2015 1220 02/06/15 0242  NA 147* 147*  K 5.1 5.1  CL 117* 118*  CO2 23 21*  BUN 63* 64*  CREATININE 1.47* 1.39*  CALCIUM 7.8* 7.5*  GLUCOSE 98 165*   Protein Profile:  Recent Labs Lab 03-02-2015 1220  ALBUMIN 1.5*    Gastrointestinal Profile: Last BM: PTA   Nutrition-Focused Physical Exam Findings:  Unable to complete Nutrition-Focused physical exam at this time.     Weight Change: unsure if any changes    Diet Order:  Diet NPO time specified  Skin:   pressure ulcer stage II coccyx   Height:   Ht Readings from Last 1 Encounters:  No data found for Ht    Weight:   Wt Readings from Last 1 Encounters:  03-02-2015 175 lb 14.4 oz (79.788 kg)     BMI:  There is no height on file to calculate BMI.  Estimated Nutritional Needs:   Kcal:  No ht available at this time    Protein:     Fluid:     EDUCATION NEEDS:   No education needs identified at this time  MODERATE Care Level  Joli B. Freida Busman, RD, LDN [phone] (pager)

## 2015-02-06 NOTE — Progress Notes (Signed)
[facility] - [place] at [facility]   PATIENT NAME: Stephanie Salas    MR#:  [id number]  DATE OF BIRTH:  Mar 07, 1924  SUBJECTIVE:  CHIEF COMPLAINT:   Chief Complaint  Patient presents with  . Altered Mental Status   Opens eyes to voice, otherwise not responsive.  REVIEW OF SYSTEMS:   ROS unable to obtain due to patient's altered mental status  DRUG ALLERGIES:   Allergies  Allergen Reactions  . Penicillins   . Pravastatin     VITALS:  Blood pressure 105/50, pulse 98, temperature 97.9 F (36.6 C), temperature source Axillary, resp. rate 19, weight 79.788 kg (175 lb 14.4 oz), SpO2 100 %.  PHYSICAL EXAMINATION:  GENERAL:  81 y.o.-year-old patient lying in the bed with no acute distress, eyes closed EYES: Does open eyes to voice, Pupils equal, round, reactive to light and accommodation. No scleral icterus.  HEENT: Head atraumatic, normocephalic. Oropharynx and nasopharynx clear.  NECK:  Supple, no jugular venous distention. No thyroid enlargement, no tenderness.  LUNGS: Shallow respirations, clear to auscultation bilaterally, fair air movement, no rhonchi or rales CARDIOVASCULAR: S1, S2 normal. 4/6 systolic ejection murmur no rubs, or gallops.  ABDOMEN: Soft, nondistended. Bowel sounds decreased. No organomegaly or mass. She seems to be guarding with palpation of the abdomen EXTREMITIES: No pedal edema, cyanosis, or clubbing. Both legs in soft boots NEUROLOGIC: Opens eyes to voice, follows some commands, otherwise does not participate in neurologic examination SKIN: No obvious rash, lesion, or ulcer.    LABORATORY PANEL:   CBC  Recent Labs Lab 02/06/15 0242  WBC 9.1  HGB 9.0*  HCT 28.0*  PLT 212   ------------------------------------------------------------------------------------------------------------------  Chemistries   Recent Labs Lab 02/19/2015 1220 02/06/15 0242  NA 147* 147*  K 5.1 5.1  CL 117* 118*  CO2 23 21*  GLUCOSE 98  165*  BUN 63* 64*  CREATININE 1.47* 1.39*  CALCIUM 7.8* 7.5*  AST 51*  --   ALT 36  --   ALKPHOS 80  --   BILITOT 0.7  --    ------------------------------------------------------------------------------------------------------------------  Cardiac Enzymes  Recent Labs Lab 02/06/15 0242  TROPONINI 0.51*   ------------------------------------------------------------------------------------------------------------------  RADIOLOGY:    EKG:   Orders placed or performed during the hospital encounter of 02/13/2015  . EKG 12-Lead  . EKG 12-Lead    ASSESSMENT AND PLAN:   Principal Problem:   Retroperitoneal hematoma Active Problems:   Pressure ulcer  #1 hemodynamic shock due to retroperitoneal hematoma:  - Critical care service is following  - CVP 12, blood pressure soft at 105/50, continue fluid support, no pressors for now - Continue to monitor hemoglobin - Initial concern was for sepsis. Blood cultures have been drawn but are less than 24 hours. I will continue broad-spectrum antibiotics until 24 hours negative. Low suspicion for sepsis.  #2 retroperitoneal hematoma - To large retroperitoneal hematomas measuring 10 x 5 and 8 x 5 cm seen on CT scan - Hemoglobin stable since admission, coags are pending - Holding aspirin and Plavix  #3 acute renal failure - Baseline creatinine is 1.1, on presentation 1.47 this is improving with fluid replacement   #4 hyperkalemia - Stable, still elevated, likely due to renal failure - No arrhythmia or EKG changes. Continue hydration  #5 elevated troponin - Likely due to demand ischemia in the setting of hemodynamic shock, trending down - We will obtain echo  #6 history of CVA/dementia - Continues to be fairly encephalopathic, will obtain CT of the head -  at baseline she does not walk, does not recognize family, somewhat verbal  #7 hyperkalemia - Stable at 147, continue hydration   CODE STATUS: DNR  TOTAL TIME TAKING CARE  OF THIS PATIENT: 45 minutes.  Greater than 50% of time spent in care coordination and counseling. Plan of care discussed with the patient's daughter Ilda Basset and also with Dr. Belia Heman. POSSIBLE D/C IN 5 DAYS, DEPENDING ON CLINICAL CONDITION.   Elby Showers M.D on 02/06/2015 at 10:55 AM  Between 7am to 6pm - Pager - [phone]  After 6pm go to [email or web address] - password [email or web address] [facility]  [place] [facility]  Office  [phone]  CC: Primary care physician; No primary care provider on file.

## 2015-02-06 NOTE — Consult Note (Addendum)
WOC wound consult note completed via [employer] Reason for Consult:  Pressure injury Wound type: Stage II Pressure injury Pressure Ulcer POA: Yes Measurement: see nurse flow sheet Wound bed: partial thickness skin loss, shallow, pale, pink  Drainage (amount, consistency, odor) none Periwound:intact  Dressing procedure/placement/frequency: Continue soft silicone foam dressing, change every 3 days and PRN soilage.  Discussed POC with patient and bedside nurse.  Re consult if needed, will not follow at this time. Thanks  [employer], CWOCN [phone])

## 2015-02-07 LAB — CBC
HEMATOCRIT: 30.9 % — AB (ref 35.0–47.0)
HEMOGLOBIN: 10 g/dL — AB (ref 12.0–16.0)
MCH: 26.9 pg (ref 26.0–34.0)
MCHC: 32.3 g/dL (ref 32.0–36.0)
MCV: 83.1 fL (ref 80.0–100.0)
Platelets: 170 10*3/uL (ref 150–440)
RBC: 3.72 MIL/uL — AB (ref 3.80–5.20)
RDW: 16.1 % — ABNORMAL HIGH (ref 11.5–14.5)
WBC: 11.2 10*3/uL — ABNORMAL HIGH (ref 3.6–11.0)

## 2015-02-07 LAB — BASIC METABOLIC PANEL
Anion gap: 5 (ref 5–15)
BUN: 52 mg/dL — ABNORMAL HIGH (ref 6–20)
CHLORIDE: 109 mmol/L (ref 101–111)
CO2: 23 mmol/L (ref 22–32)
CREATININE: 1.04 mg/dL — AB (ref 0.44–1.00)
Calcium: 7.6 mg/dL — ABNORMAL LOW (ref 8.9–10.3)
GFR calc non Af Amer: 47 mL/min — ABNORMAL LOW (ref >60)
GFR, EST AFRICAN AMERICAN: 54 mL/min — AB (ref >60)
Glucose, Bld: 237 mg/dL — ABNORMAL HIGH (ref 65–99)
POTASSIUM: 4.3 mmol/L (ref 3.5–5.1)
SODIUM: 137 mmol/L (ref 135–145)

## 2015-02-07 LAB — URINE CULTURE
CULTURE: NO GROWTH
Special Requests: NORMAL

## 2015-02-07 LAB — TYPE AND SCREEN
ABO/RH(D): A POS
ANTIBODY SCREEN: NEGATIVE
UNIT DIVISION: 0

## 2015-02-07 MED ORDER — VANCOMYCIN HCL IN DEXTROSE 750-5 MG/150ML-% IV SOLN
750.0000 mg | INTRAVENOUS | Status: DC
  Administered 2015-02-07: 750 mg via INTRAVENOUS
  Filled 2015-02-07 (×2): qty 150

## 2015-02-07 MED ORDER — SODIUM CHLORIDE 0.9 % IV SOLN
1.0000 g | Freq: Two times a day (BID) | INTRAVENOUS | Status: DC
  Administered 2015-02-07 – 2015-02-08 (×2): 1 g via INTRAVENOUS
  Filled 2015-02-07 (×4): qty 1

## 2015-02-07 MED ORDER — SODIUM CHLORIDE 0.9 % IV BOLUS (SEPSIS)
1000.0000 mL | Freq: Once | INTRAVENOUS | Status: AC
  Administered 2015-02-07: 1000 mL via INTRAVENOUS

## 2015-02-07 NOTE — Progress Notes (Signed)
[facility] - [place] at [facility]   PATIENT NAME: Stephanie Salas    MR#:  [id number]  DATE OF BIRTH:  03/30/1924  SUBJECTIVE:  CHIEF COMPLAINT:   Chief Complaint  Patient presents with  . Altered Mental Status   Opens eyes to voice, gives one-word answers to questions  REVIEW OF SYSTEMS:   ROS unable to obtain due to patient's altered mental status  DRUG ALLERGIES:   Allergies  Allergen Reactions  . Penicillins   . Pravastatin     VITALS:  Blood pressure 95/49, pulse 94, temperature 97.5 F (36.4 C), temperature source Axillary, resp. rate 23, height  (1.676 m), weight 71.8 kg (158 lb 4.6 oz), SpO2 100 %.  PHYSICAL EXAMINATION:  GENERAL:  81 y.o.-year-old patient lying in the bed with no acute distress, eyes closed EYES: Does open eyes to voice, Pupils equal, round, reactive to light and accommodation. No scleral icterus.  HEENT: Head atraumatic, normocephalic. Oropharynx and nasopharynx clear.  NECK:  Supple, no jugular venous distention. No thyroid enlargement, no tenderness.  LUNGS: Shallow respirations, clear to auscultation bilaterally, fair air movement, no rhonchi or rales CARDIOVASCULAR: S1, S2 normal. 4/6 systolic ejection murmur no rubs, or gallops.  ABDOMEN: Soft, nondistended. Bowel sounds decreased. No organomegaly or mass. She seems to be guarding with palpation of the abdomen EXTREMITIES: No pedal edema, cyanosis, or clubbing. Both legs in soft boots NEUROLOGIC: Opens eyes to voice, follows some commands, otherwise does not participate in neurologic examination SKIN: No obvious rash, lesion, or ulcer.    LABORATORY PANEL:   CBC  Recent Labs Lab 02/07/15 0442  WBC 11.2*  HGB 10.0*  HCT 30.9*  PLT 170   ------------------------------------------------------------------------------------------------------------------  Chemistries   Recent Labs Lab 02/04/2015 1220  02/07/15 0442  NA 147*  < > 137  K 5.1  < >  4.3  CL 117*  < > 109  CO2 23  < > 23  GLUCOSE 98  < > 237*  BUN 63*  < > 52*  CREATININE 1.47*  < > 1.04*  CALCIUM 7.8*  < > 7.6*  AST 51*  --   --   ALT 36  --   --   ALKPHOS 80  --   --   BILITOT 0.7  --   --   < > = values in this interval not displayed. ------------------------------------------------------------------------------------------------------------------  Cardiac Enzymes  Recent Labs Lab 02/06/15 1748  TROPONINI 0.23*   ------------------------------------------------------------------------------------------------------------------  RADIOLOGY:    EKG:   Orders placed or performed during the hospital encounter of 02/15/2015  . EKG 12-Lead  . EKG 12-Lead    ASSESSMENT AND PLAN:   Principal Problem:   Retroperitoneal hematoma Active Problems:   Pressure ulcer  #1 hemodynamic shock due to retroperitoneal hematoma:  - Critical care service is following  - Has been on Levothroid for past 24 hours attempting to titrate down - Attempting to stop IV fluids as she is becoming edematous - Initial concern was for sepsis. Blood cultures negative 48 hours. I will discontinue anti-biotics #2 retroperitoneal hematoma  #2 retroperitoneal hematomas measuring 10 x 5 and 8 x 5 cm seen on CT scan - Hemoglobin dropped from 9-7 yesterday, 1 unit packed red blood cells received on 9/17 now hemoglobin 10 - Holding aspirin and Plavix - Coags are normal  #3 acute renal failure due to ATN, hypovolemia: Resolved - Baseline creatinine is 1.1, now 1.0 after volume replacement   #4 hyperkalemia: Resolved - Stable,  still elevated, likely due to renal failure  #5 elevated troponin - Likely due to demand ischemia in the setting of hemodynamic shock, trending down  #6 history of CVA/dementia - Continues to be fairly encephalopathic, - at baseline she does not walk, does not recognize family, somewhat verbal  #7 hyperkalemia: Resolved with IV hydration  CODE STATUS:  DNR  TOTAL CRITICAL CARE TIME TAKING CARE OF THIS PATIENT: 45 minutes.  Greater than 50% of time spent in care coordination and counseling. Plan of care discussed with the patient's daughter Ilda Basset and also with Dr. Belia Heman. POSSIBLE D/C IN 5 DAYS, DEPENDING ON CLINICAL CONDITION.   Elby Showers M.D on 02/07/2015 at 12:27 PM  Between 7am to 6pm - Pager - [phone]  After 6pm go to [email or web address] - password [email or web address] [facility]  [place] [facility]  Office  [phone]  CC: Primary care physician; No primary care provider on file.

## 2015-02-07 NOTE — Progress Notes (Signed)
ANTIBIOTIC CONSULT NOTE - Follow up  Pharmacy Consult for Meropenem & Vancomycin Indication: sepsis  Allergies  Allergen Reactions  . Penicillins   . Pravastatin     Patient Measurements: Height:  (167.6 cm) Weight: 158 lb 4.6 oz (71.8 kg) IBW/kg (Calculated) : 59.3 Adjusted Body Weight: 79.8 kg  Vital Signs: Temp: 97.5 F (36.4 C) (09/18 1148) Temp Source: Axillary (09/18 1148) BP: 105/50 mmHg (09/18 1400) Pulse Rate: 95 (09/18 1400)  Labs:  Recent Labs  02/06/15 0242 02/06/15 1423 02/07/15 0442  WBC 9.1 8.6 11.2*  HGB 9.0* 7.7* 10.0*  PLT 212 175 170  CREATININE 1.39* 1.17* 1.04*   Estimated Creatinine Clearance: 38 mL/min (by C-G formula based on Cr of 1.04).    Microbiology: Recent Results (from the past 720 hour(s))  Urine culture     Status: None   Collection Time: 02/09/2015 12:47 PM  Result Value Ref Range Status   Specimen Description URINE, CLEAN CATCH  Final   Special Requests Normal  Final   Culture NO GROWTH 2 DAYS  Final   Report Status 02/07/2015 FINAL  Final  Culture, blood (routine x 2)     Status: None (Preliminary result)   Collection Time: 02/02/2015 12:47 PM  Result Value Ref Range Status   Specimen Description BLOOD RIGHT HAND  Final   Special Requests BOTTLES DRAWN AEROBIC AND ANAEROBIC  3CC  Final   Culture NO GROWTH 2 DAYS  Final   Report Status PENDING  Incomplete  Culture, blood (routine x 2)     Status: None (Preliminary result)   Collection Time: 02/14/2015  1:19 PM  Result Value Ref Range Status   Specimen Description BLOOD LEFT HAND  Final   Special Requests   Final    BOTTLES DRAWN AEROBIC AND ANAEROBIC 1CC AEROBIC,1CC ANAEROBIC   Culture NO GROWTH 2 DAYS  Final   Report Status PENDING  Incomplete  MRSA PCR Screening     Status: None   Collection Time: 02/16/2015  8:39 PM  Result Value Ref Range Status   MRSA by PCR NEGATIVE NEGATIVE Final    Comment:        The GeneXpert MRSA Assay (FDA approved for NASAL  specimens only), is one component of a comprehensive MRSA colonization surveillance program. It is not intended to diagnose MRSA infection nor to guide or monitor treatment for MRSA infections.     Medical History: See complete H&P.  Assessment: Possible abdominal infection/sepsis  Goal of Therapy:  Vancomycin trough 15-20 mcg/ml  Plan:  1 - Load with vancomycin  and start 500 mg every 24 hours 2 - Meropenem 500 mg IV every 12 hours  9/17: Per MD note: Initial concern was for sepsis. Blood cultures have been drawn but are less than 24 hours. Continue broad-spectrum antibiotics until 24 hours negative. Low suspicion for sepsis. Will continue current regimen.  9/18: Scr improved. WBC increased. Per Dr. Clent Ridges, actually wants to continue antibiotics today. Will increase Vancomycin to  IV q24h and check trough 9/20 at 1630. Ke 0.033  T1/2 21  Vd 50. Will increase Meropenem to 1 gram IV q12h.  Bari Mantis PharmD Clinical Pharmacist 02/07/2015 3:39 PM

## 2015-02-07 NOTE — Consult Note (Signed)
Date: 02/07/2015                  Patient Name:  Stephanie Salas  MRN: [id number]  DOB: 10-May-1924  Age / Sex: 81 y.o., female         PCP: No primary care provider on file.                 Service Requesting Consult:  internal medicine                  Reason for Consult:  acute renal failure             History of Present Illness: Patient is a 81 y.o. female with medical problems of diabetes, dementia, CABG, stroke, gout, hypertension, who was admitted to [facility] on 02/18/2015 for evaluation of altered mental status and hypotension.  Patient is lethargic and encephalopathic. She is not able to provide any meaningful history. Most of the information is obtained from the chart. Per H&P, at baseline, she is able to feed herself at time. She is wheelchair-bound. One week prior to admission, she had poor appetite. She was empirically being treated for a infection in the nursing home. Source was unknown. Last known creatinine is 1.10 from August 2013 Admission creatinine is 1.47 with high BUN of 63 Serum creatinine has slowly improved with IV fluid supplementation Today's creatinine is 1.04  Medications: Outpatient medications: Prescriptions prior to admission  Medication Sig Dispense Refill Last Dose  . acetaminophen (TYLENOL) 650 MG suppository Place 650 mg rectally every 4 (four) hours as needed.     Marland Kitchen albuterol (PROVENTIL) (2.5 MG/3ML) 0.083% nebulizer solution Take 2.5 mg by nebulization every 2 (two) hours as needed for wheezing or shortness of breath.     . Amino Acids-Protein Hydrolys (FEEDING SUPPLEMENT, PRO-STAT SUGAR FREE 64,) LIQD Take 30 mLs by mouth 2 (two) times daily.     Marland Kitchen amLODipine (NORVASC) 2.5 MG tablet Take 2.5 mg by mouth daily.     Marland Kitchen aspirin EC 81 MG tablet Take 81 mg by mouth daily.     . clopidogrel (PLAVIX) 75 MG tablet Take 75 mg by mouth daily.     . colchicine 0.6 MG tablet Take 0.6 mg by mouth 4 (four) times daily.     Marland Kitchen donepezil (ARICEPT) 5 MG tablet Take  5 mg by mouth at bedtime.     . ergocalciferol (VITAMIN D2) 50000 UNITS capsule Take 50,000 Units by mouth every 30 (thirty) days.     . [EXPIRED] ertapenem (INVANZ) 1 G SOLR injection Inject 1 g into the muscle at bedtime.   02/04/2015 at 2100  . lisinopril (PRINIVIL,ZESTRIL) 20 MG tablet Take 20 mg by mouth daily.     . memantine (NAMENDA) 10 MG tablet Take 10 mg by mouth 2 (two) times daily.     . metoprolol tartrate (LOPRESSOR) 25 MG tablet Take 25 mg by mouth 2 (two) times daily.     . Multiple Vitamin (MULTIVITAMIN) tablet Take 1 tablet by mouth daily.     . polyethylene glycol (MIRALAX / GLYCOLAX) packet Take 17 g by mouth daily.     . potassium chloride in sodium chloride 0.45 % 1,000 mL Inject 1 Dose into the vein continuous.     . potassium chloride SA (K-DUR,KLOR-CON) 20 MEQ tablet Take 20 mEq by mouth daily.       Current medications: Current Facility-Administered Medications  Medication Dose Route Frequency Provider Last Rate Last Dose  .  0.45 % sodium chloride infusion   Intravenous Continuous Houston Siren, MD 125 mL/hr at 02/06/15 1429    . acetaminophen (TYLENOL) tablet 650 mg  650 mg Oral Q6H PRN Houston Siren, MD       Or  . acetaminophen (TYLENOL) suppository 650 mg  650 mg Rectal Q6H PRN Houston Siren, MD      . antiseptic oral rinse (CPC / CETYLPYRIDINIUM CHLORIDE 0.05%) solution 7 mL  7 mL Mouth Rinse q12n4p Gale Journey, MD   7 mL at 02/06/15 1559  . chlorhexidine (PERIDEX) 0.12 % solution 15 mL  15 mL Mouth Rinse BID Gale Journey, MD   15 mL at 02/06/15 2200  . meropenem (MERREM) 500 mg in sodium chloride 0.9 % 50 mL IVPB  500 mg Intravenous Q12H Houston Siren, MD 100 mL/hr at 02/07/15 0449 500 mg at 02/07/15 0449  . norepinephrine (LEVOPHED) 16 mg in dextrose 5 % 250 mL (0.064 mg/mL) infusion  0-40 mcg/min Intravenous Titrated Gale Journey, MD 28.1 mL/hr at 02/07/15 0630 30 mcg/min at 02/07/15 0630  . ondansetron (ZOFRAN) tablet 4 mg  4 mg Oral  Q6H PRN Houston Siren, MD       Or  . ondansetron (ZOFRAN) injection 4 mg  4 mg Intravenous Q6H PRN Houston Siren, MD      . vancomycin (VANCOCIN) 1,250 mg in sodium chloride 0.9 % 250 mL IVPB  1,250 mg Intravenous Once Houston Siren, MD      . vancomycin (VANCOCIN) 500 mg in sodium chloride 0.9 % 100 mL IVPB  500 mg Intravenous Q24H Houston Siren, MD   500 mg at 02/06/15 1741      Allergies: Allergies  Allergen Reactions  . Penicillins   . Pravastatin       Past Medical History: Past Medical History  Diagnosis Date  . Hypertension   . Diabetes mellitus without complication   . Dementia   . Recurrent UTI   . Hx of CABG   . History of CVA (cerebrovascular accident)   . Gout      Past Surgical History: History reviewed. No pertinent past surgical history.   Family History: Family History  Problem Relation Age of Onset  . Prostate cancer Father      Social History: Social History   Social History  . Marital Status: Widowed    Spouse Name: N/A  . Number of Children: N/A  . Years of Education: N/A   Occupational History  . Not on file.   Social History Main Topics  . Smoking status: Never Smoker   . Smokeless tobacco: Never Used  . Alcohol Use: No  . Drug Use: Not on file  . Sexual Activity: No   Other Topics Concern  . Not on file   Social History Narrative  . No narrative on file     Review of Systems: Unavailable due to patient's baseline dementia and she is lethargic today. Not able to answer any questions. Vital Signs: Blood pressure 105/51, pulse 99, temperature 97.4 F (36.3 C), temperature source Axillary, resp. rate 25, height 5\' 6"  (1.676 m), weight 71.8 kg (158 lb 4.6 oz), SpO2 100 %.   Intake/Output Summary (Last 24 hours) at 02/07/15 0941 Last data filed at 02/07/15 0905  Gross per 24 hour  Intake 6805.99 ml  Output    850 ml  Net 5955.99 ml    Weight trends: Filed Weights   02/04/2015 1211 02/15/2015 1900  Weight: 79.788  kg (175 lb 14.4 oz) 71.8 kg (158 lb 4.6 oz)    Physical Exam: General:  frail, elderly woman, laying in the bed   HEENT  eyes closed, moist mucous membranes   Neck:  no masses   Lungs:  bilateral mild diffuse crackles, normal effort   Heart::  tachycardic, irregular   Abdomen:  soft, nontender, nondistended   Extremities:  1+ pitting edema in dependent areas, bilateral feet in soft heel supports   Neurologic:  somnolent, did not answer any questions   Skin:  no acute rashes      Foley:  present        Lab results: Basic Metabolic Panel:  Recent Labs Lab 02/06/15 0242 02/06/15 1423 02/07/15 0442  NA 147* 139 137  K 5.1 4.6 4.3  CL 118* 110 109  CO2 21* 22 23  GLUCOSE 165* 277* 237*  BUN 64* 63* 52*  CREATININE 1.39* 1.17* 1.04*  CALCIUM 7.5* 7.6* 7.6*    Liver Function Tests:  Recent Labs Lab 02/09/2015 1220  AST 51*  ALT 36  ALKPHOS 80  BILITOT 0.7  PROT 6.2*  ALBUMIN 1.5*   No results for input(s): LIPASE, AMYLASE in the last 168 hours. No results for input(s): AMMONIA in the last 168 hours.  CBC:  Recent Labs Lab 02/19/2015 1220  02/06/15 1423 02/07/15 0442  WBC 8.8  < > 8.6 11.2*  NEUTROABS 7.6*  --   --   --   HGB 9.1*  < > 7.7* 10.0*  HCT 28.2*  < > 24.3* 30.9*  MCV 82.7  < > 83.5 83.1  PLT 221  < > 175 170  < > = values in this interval not displayed.  Cardiac Enzymes:  Recent Labs Lab 02/06/15 1748  TROPONINI 0.23*    BNP: Invalid input(s): POCBNP  CBG:  Recent Labs Lab 02/04/2015 2103  GLUCAP 77    Microbiology: Recent Results (from the past 720 hour(s))  Urine culture     Status: None (Preliminary result)   Collection Time: 01/24/2015 12:47 PM  Result Value Ref Range Status   Specimen Description URINE, CLEAN CATCH  Final   Special Requests Normal  Final   Culture NO GROWTH < 24 HOURS  Final   Report Status PENDING  Incomplete  Culture, blood (routine x 2)     Status: None (Preliminary result)   Collection Time: 02/08/2015  12:47 PM  Result Value Ref Range Status   Specimen Description BLOOD RIGHT HAND  Final   Special Requests BOTTLES DRAWN AEROBIC AND ANAEROBIC  3CC  Final   Culture NO GROWTH < 24 HOURS  Final   Report Status PENDING  Incomplete  Culture, blood (routine x 2)     Status: None (Preliminary result)   Collection Time: 01/28/2015  1:19 PM  Result Value Ref Range Status   Specimen Description BLOOD LEFT HAND  Final   Special Requests   Final    BOTTLES DRAWN AEROBIC AND ANAEROBIC 1CC AEROBIC,1CC ANAEROBIC   Culture NO GROWTH < 24 HOURS  Final   Report Status PENDING  Incomplete  MRSA PCR Screening     Status: None   Collection Time: 02/08/2015  8:39 PM  Result Value Ref Range Status   MRSA by PCR NEGATIVE NEGATIVE Final    Comment:        The GeneXpert MRSA Assay (FDA approved for NASAL specimens only), is one component of a comprehensive MRSA colonization surveillance program. It is not  intended to diagnose MRSA infection nor to guide or monitor treatment for MRSA infections.      Coagulation Studies:  Recent Labs  02/06/15 1423  LABPROT 16.6*  INR 1.32    Urinalysis:  Recent Labs  03/02/2015 1247  COLORURINE YELLOW*  LABSPEC 1.019  PHURINE 5.0  GLUCOSEU NEGATIVE  HGBUR NEGATIVE  BILIRUBINUR NEGATIVE  KETONESUR NEGATIVE  PROTEINUR NEGATIVE  NITRITE NEGATIVE  LEUKOCYTESUR NEGATIVE      Imaging:     Assessment & Plan: Patient is a 81 y.o. female with medical problems of diabetes, dementia, CABG, stroke, gout, hypertension, who was admitted to [facility] on 2015-03-02 for evaluation of altered mental status and hypotension.   1. Acute renal failure Likely secondary to volume depletion and ATN. Patient was volume deplete as outpatient because of poor oral intake Home medication list includes amlodipine, lisinopril, metoprolol. This could cause ATN from hypotension in the setting of volume depletion from poor by mouth intake Serum creatinine has slowly improved and is  down to 1.04 with IV fluid supplementation Plan: Discontinue IV supplementation as patient is starting to get fluid overloaded Continue to monitor clinically, avoid hypotension Will follow

## 2015-02-07 NOTE — Consult Note (Signed)
[facility] Pulmonary Medicine Consultation      Name: Stephanie Salas MRN: [id number] DOB: 1924/01/16    ADMISSION DATE:  01/28/2015    CHIEF COMPLAINT:     Mental status changes and low blood pressure  HISTORY OF PRESENT ILLNESS    Remains  encephalopathic and lethargic. On levaphed infusion, wean vasopressors as tolerated Patient given 1 unit PRBC yesterday, follow h/h central line placed for vasorpessor therapy, Patient is DNR/DNI       PAST MEDICAL HISTORY    :  Past Medical History  Diagnosis Date  . Hypertension   . Diabetes mellitus without complication   . Dementia   . Recurrent UTI   . Hx of CABG   . History of CVA (cerebrovascular accident)   . Gout    History reviewed. No pertinent past surgical history. Prior to Admission medications   Medication Sig Start Date End Date Taking? Authorizing Provider  acetaminophen (TYLENOL) 650 MG suppository Place 650 mg rectally every 4 (four) hours as needed.   Yes Historical Provider, MD  albuterol (PROVENTIL) (2.5 MG/3ML) 0.083% nebulizer solution Take 2.5 mg by nebulization every 2 (two) hours as needed for wheezing or shortness of breath.   Yes Historical Provider, MD  Amino Acids-Protein Hydrolys (FEEDING SUPPLEMENT, PRO-STAT SUGAR FREE 64,) LIQD Take 30 mLs by mouth 2 (two) times daily.   Yes Historical Provider, MD  amLODipine (NORVASC) 2.5 MG tablet Take 2.5 mg by mouth daily.   Yes Historical Provider, MD  aspirin EC 81 MG tablet Take 81 mg by mouth daily.   Yes Historical Provider, MD  clopidogrel (PLAVIX) 75 MG tablet Take 75 mg by mouth daily.   Yes Historical Provider, MD  colchicine 0.6 MG tablet Take 0.6 mg by mouth 4 (four) times daily.   Yes Historical Provider, MD  donepezil (ARICEPT) 5 MG tablet Take 5 mg by mouth at bedtime.   Yes Historical Provider, MD  ergocalciferol (VITAMIN D2) 50000 UNITS capsule Take 50,000 Units by mouth every 30 (thirty) days.   Yes Historical Provider, MD  ertapenem  [facility]) 1 G SOLR injection Inject 1 g into the muscle at bedtime. 02/03/15 02/15/2015 Yes Historical Provider, MD  lisinopril (PRINIVIL,ZESTRIL) 20 MG tablet Take 20 mg by mouth daily.   Yes Historical Provider, MD  memantine (NAMENDA) 10 MG tablet Take 10 mg by mouth 2 (two) times daily.   Yes Historical Provider, MD  metoprolol tartrate (LOPRESSOR) 25 MG tablet Take 25 mg by mouth 2 (two) times daily.   Yes Historical Provider, MD  Multiple Vitamin (MULTIVITAMIN) tablet Take 1 tablet by mouth daily.   Yes Historical Provider, MD  polyethylene glycol (MIRALAX / GLYCOLAX) packet Take 17 g by mouth daily.   Yes Historical Provider, MD  potassium chloride in sodium chloride 0.45 % 1,000 mL Inject 1 Dose into the vein continuous.   Yes Historical Provider, MD  potassium chloride SA (K-DUR,KLOR-CON) 20 MEQ tablet Take 20 mEq by mouth daily.   Yes Historical Provider, MD   Allergies  Allergen Reactions  . Penicillins   . Pravastatin      FAMILY HISTORY   Family History  Problem Relation Age of Onset  . Prostate cancer Father       SOCIAL HISTORY    reports that she has never smoked. She has never used smokeless tobacco. She reports that she does not drink alcohol. Her drug history is not on file.  Review of Systems  Unable to perform ROS: critical illness  VITAL SIGNS    Temp:  [97.4 F (36.3 C)-99.1 F (37.3 C)] 97.4 F (36.3 C) (09/18 0734) Pulse Rate:  [68-105] 100 (09/18 0700) Resp:  [13-25] 23 (09/18 0600) BP: (47-118)/(30-59) 106/44 mmHg (09/18 0700) SpO2:  [79 %-100 %] 100 % (09/18 0700) HEMODYNAMICS: CVP:  [10 mmHg-13 mmHg] 11 mmHg VENTILATOR SETTINGS:   INTAKE / OUTPUT:  Intake/Output Summary (Last 24 hours) at 02/07/15 0816 Last data filed at 02/07/15 0630  Gross per 24 hour  Intake 5805.99 ml  Output    850 ml  Net 4955.99 ml       PHYSICAL EXAM   Physical Exam  GENERAL:AAF, lying in the bed encephalopathic/lethargic and critically ill  appearing.  EYES: Pupils equal, round, reactive to light and accommodation. No scleral icterus.  HEENT: Head atraumatic, normocephalic. Dry oral mucosa NECK: Supple,  LUNGS: Normal breath sounds bilaterally, no wheezing, rales, rhonchi. No use of accessory muscles of respiration.  CARDIOVASCULAR: S1, S2 RRR. No murmurs, ABDOMEN: Soft, tender diffusely, no rebound, rigidity. nondistended. Bowel sounds hypoactive.  EXTREMITIES: No pedal edema, cyanosis, or clubbing. + 2 pedal & radial pulses b/l.  NEUROLOGIC: Difficult to do a full neurological exam. Patient grimaces to pain. Globally weak. PSYCHIATRIC: Encephalopathic/lethargic. SKIN: No obvious rash, lesion, or ulcer.     LABS   LABS:  CBC  Recent Labs Lab 02/06/15 0242 02/06/15 1423 02/07/15 0442  WBC 9.1 8.6 11.2*  HGB 9.0* 7.7* 10.0*  HCT 28.0* 24.3* 30.9*  PLT 212 175 170   Coag's  Recent Labs Lab 02/06/15 1423  INR 1.32   BMET  Recent Labs Lab 02/06/15 0242 02/06/15 1423 02/07/15 0442  NA 147* 139 137  K 5.1 4.6 4.3  CL 118* 110 109  CO2 21* 22 23  BUN 64* 63* 52*  CREATININE 1.39* 1.17* 1.04*  GLUCOSE 165* 277* 237*   Electrolytes  Recent Labs Lab 02/06/15 0242 02/06/15 1423 02/07/15 0442  CALCIUM 7.5* 7.6* 7.6*   Sepsis Markers  Recent Labs Lab 02/16/2015 1247 02/10/2015 1640 01/21/2015 2228  LATICACIDVEN 2.3* 2.0 1.4   ABG  Recent Labs Lab 02/16/2015 1630  PHART 7.35  PCO2ART 40  PO2ART 105   Liver Enzymes  Recent Labs Lab 01/27/2015 1220  AST 51*  ALT 36  ALKPHOS 80  BILITOT 0.7  ALBUMIN 1.5*   Cardiac Enzymes  Recent Labs Lab 02/06/15 0242 02/06/15 1130 02/06/15 1748  TROPONINI 0.51* 0.33* 0.23*   Glucose  Recent Labs Lab 02/08/2015 2103  GLUCAP 77     Recent Results (from the past 240 hour(s))  Urine culture     Status: None (Preliminary result)   Collection Time: 01/27/2015 12:47 PM  Result Value Ref Range Status   Specimen Description URINE, CLEAN  CATCH  Final   Special Requests Normal  Final   Culture NO GROWTH < 24 HOURS  Final   Report Status PENDING  Incomplete  Culture, blood (routine x 2)     Status: None (Preliminary result)   Collection Time: 02/12/2015 12:47 PM  Result Value Ref Range Status   Specimen Description BLOOD RIGHT HAND  Final   Special Requests BOTTLES DRAWN AEROBIC AND ANAEROBIC  3CC  Final   Culture NO GROWTH < 24 HOURS  Final   Report Status PENDING  Incomplete  Culture, blood (routine x 2)     Status: None (Preliminary result)   Collection Time: 01/24/2015  1:19 PM  Result Value Ref Range Status   Specimen Description BLOOD LEFT HAND  Final   Special Requests   Final    BOTTLES DRAWN AEROBIC AND ANAEROBIC 1CC AEROBIC,1CC ANAEROBIC   Culture NO GROWTH < 24 HOURS  Final   Report Status PENDING  Incomplete  MRSA PCR Screening     Status: None   Collection Time: 02-23-2015  8:39 PM  Result Value Ref Range Status   MRSA by PCR NEGATIVE NEGATIVE Final    Comment:        The GeneXpert MRSA Assay (FDA approved for NASAL specimens only), is one component of a comprehensive MRSA colonization surveillance program. It is not intended to diagnose MRSA infection nor to guide or monitor treatment for MRSA infections.      Current facility-administered medications:  .  0.45 % sodium chloride infusion, , Intravenous, Continuous, Houston Siren, MD, Last Rate: 125 mL/hr at 02/06/15 1429 .  acetaminophen (TYLENOL) tablet 650 mg, 650 mg, Oral, Q6H PRN **OR** acetaminophen (TYLENOL) suppository 650 mg, 650 mg, Rectal, Q6H PRN, Houston Siren, MD .  antiseptic oral rinse (CPC / CETYLPYRIDINIUM CHLORIDE 0.05%) solution 7 mL, 7 mL, Mouth Rinse, q12n4p, Gale Journey, MD, 7 mL at 02/06/15 1559 .  chlorhexidine (PERIDEX) 0.12 % solution 15 mL, 15 mL, Mouth Rinse, BID, Gale Journey, MD, 15 mL at 02/06/15 2200 .  meropenem (MERREM) 500 mg in sodium chloride 0.9 % 50 mL IVPB, 500 mg, Intravenous, Q12H, Houston Siren, MD, Last Rate: 100 mL/hr at 02/07/15 0449, 500 mg at 02/07/15 0449 .  norepinephrine (LEVOPHED) 16 mg in dextrose 5 % 250 mL (0.064 mg/mL) infusion, 0-40 mcg/min, Intravenous, Titrated, Gale Journey, MD, Last Rate: 28.1 mL/hr at 02/07/15 0630, 30 mcg/min at 02/07/15 0630 .  ondansetron (ZOFRAN) tablet 4 mg, 4 mg, Oral, Q6H PRN **OR** ondansetron (ZOFRAN) injection 4 mg, 4 mg, Intravenous, Q6H PRN, Houston Siren, MD .  vancomycin (VANCOCIN) 1,250 mg in sodium chloride 0.9 % 250 mL IVPB, 1,250 mg, Intravenous, Once, Houston Siren, MD .  vancomycin (VANCOCIN) 500 mg in sodium chloride 0.9 % 100 mL IVPB, 500 mg, Intravenous, Q24H, Houston Siren, MD, 500 mg at 02/06/15 1741  IMAGING   CT abd shows RETRO PERITONEAL hematomas     Indwelling Urinary Catheter continued, requirement due to   Reason to continue Indwelling Urinary Catheter for strict Intake/Output monitoring for hemodynamic instability   Central Line continued, requirement due to   Reason to continue [employer] Monitoring of central venous pressure or other hemodynamic parameters   MAJOR EVENTS/TEST RESULTS:  ICU admission 9/16 for shock 9/16 CT abd pelvis notes intra abd hematoma 9/17 1 unit prbc given 9/18 remains on vasopressors   INDWELLING DEVICES:: Left IJ CVL>>9/16  MICRO DATA: MRSA PCR  Urine  Blood Resp   ANTIMICROBIALS:     ASSESSMENT/PLAN   81 yo AAF with acute mental status/encephlopathy with acute abd pain with acute hypovolumic shock from acute retro-pertioneal hematomas  PULMONARY Oxygen as needed  CARDIOVASCULAR -Hypovolumic shock-IVF's,  -follow h/h, transfuse as needed  RENAL -ARF from ATN -follow chem 7 Follow UO  GASTROINTESTINAL Acute retro perotoneal hematoma check coags -keep NPO for now   HEMATOLOGIC Follow h/h transfuse as needed  INFECTIOUS -no source at this time   ENDOCRINE -Follow fsbs  NEUROLOGIC encephlopathy from shock/acidosis -avoid  sedatives  Recommend Palliative care consult   I have personally obtained a history, examined the patient, evaluated laboratory and independently reviewed  imaging results, formulated the assessment and plan and placed orders.  The Patient requires high complexity decision making for assessment and support, frequent evaluation and titration of therapies, application of advanced monitoring technologies and extensive interpretation of multiple databases. Critical Care Time devoted to patient care services described in this note is 35 minutes.   Overall, patient is critically ill, prognosis is guarded. Patient at high risk for cardiac arrest and death.    Lucie Leather, M.D.  [facility]  Medical Director [facility] Medical Director [facility] Cardio-Pulmonary Department

## 2015-02-08 ENCOUNTER — Inpatient Hospital Stay
Admit: 2015-02-08 | Discharge: 2015-02-08 | Disposition: A | Discharge status: Home / Self Care | Payer: Medicare Other | Attending: Internal Medicine | Admitting: Internal Medicine

## 2015-02-08 DIAGNOSIS — I959 Hypotension, unspecified: Secondary | ICD-10-CM

## 2015-02-08 LAB — URINALYSIS COMPLETE WITH MICROSCOPIC (ARMC ONLY)
BILIRUBIN URINE: NEGATIVE
GLUCOSE, UA: NEGATIVE mg/dL
Hgb urine dipstick: NEGATIVE
Ketones, ur: NEGATIVE mg/dL
LEUKOCYTES UA: NEGATIVE
Nitrite: NEGATIVE
Protein, ur: NEGATIVE mg/dL
Specific Gravity, Urine: 1.013 (ref 1.005–1.030)
pH: 5 (ref 5.0–8.0)

## 2015-02-08 LAB — GLUCOSE, CAPILLARY
GLUCOSE-CAPILLARY: 192 mg/dL — AB (ref 65–99)
GLUCOSE-CAPILLARY: 133 mg/dL — AB (ref 65–99)

## 2015-02-08 LAB — BASIC METABOLIC PANEL
ANION GAP: 5 (ref 5–15)
BUN: 41 mg/dL — ABNORMAL HIGH (ref 6–20)
CALCIUM: 7.6 mg/dL — AB (ref 8.9–10.3)
CO2: 22 mmol/L (ref 22–32)
CREATININE: 0.83 mg/dL (ref 0.44–1.00)
Chloride: 110 mmol/L (ref 101–111)
GLUCOSE: 196 mg/dL — AB (ref 65–99)
Potassium: 4.2 mmol/L (ref 3.5–5.1)
Sodium: 137 mmol/L (ref 135–145)

## 2015-02-08 LAB — CBC
HEMATOCRIT: 30.8 % — AB (ref 35.0–47.0)
Hemoglobin: 10.2 g/dL — ABNORMAL LOW (ref 12.0–16.0)
MCH: 27.3 pg (ref 26.0–34.0)
MCHC: 33.3 g/dL (ref 32.0–36.0)
MCV: 81.9 fL (ref 80.0–100.0)
PLATELETS: 150 10*3/uL (ref 150–440)
RBC: 3.76 MIL/uL — ABNORMAL LOW (ref 3.80–5.20)
RDW: 16.2 % — AB (ref 11.5–14.5)
WBC: 14.2 10*3/uL — AB (ref 3.6–11.0)

## 2015-02-08 MED ORDER — SODIUM CHLORIDE 0.9 % IV BOLUS (SEPSIS)
500.0000 mL | Freq: Once | INTRAVENOUS | Status: AC
  Administered 2015-02-08: 500 mL via INTRAVENOUS

## 2015-02-08 MED ORDER — SODIUM CHLORIDE 0.9 % IV SOLN
INTRAVENOUS | Status: DC
  Administered 2015-02-08 – 2015-02-09 (×3): via INTRAVENOUS

## 2015-02-08 MED ORDER — INSULIN ASPART 100 UNIT/ML ~~LOC~~ SOLN
0.0000 [IU] | Freq: Three times a day (TID) | SUBCUTANEOUS | Status: DC
  Administered 2015-02-08: 2 [IU] via SUBCUTANEOUS
  Administered 2015-02-09 – 2015-02-10 (×4): 1 [IU] via SUBCUTANEOUS
  Administered 2015-02-11: 2 [IU] via SUBCUTANEOUS
  Filled 2015-02-08: qty 3
  Filled 2015-02-08 (×3): qty 1
  Filled 2015-02-08: qty 2
  Filled 2015-02-08: qty 1

## 2015-02-08 NOTE — Care Management Note (Addendum)
Case Management Note  Patient Details  Name: ANNEMARIE SEBREE MRN: [id number] Date of Birth: September 01, 1923  Subjective/Objective:   Admitted with acute mental status/encephlopathy, acute abd pain, acute hypovolumic shock from acute retro-pertioneal hematomas. HX of dementia, wheelchair bound. Briefly spoke with daughter, Mrs. Greer Pickerel. Offered support.  Patient is from [facility].  CSW updated on SNF. Palliative care consult in place.  Will follow progression               Action/Plan: [facility]  Expected Discharge Date:                  Expected Discharge Plan:  Skilled Nursing Facility  In-House Referral:  Clinical Social Work  Discharge planning Services     Post Acute Care Choice:    Choice offered to:     DME Arranged:    DME Agency:     HH Arranged:    HH Agency:     Status of Service:  In process, will continue to follow  Medicare Important Message Given:    Date Medicare IM Given:    Medicare IM give by:    Date Additional Medicare IM Given:    Additional Medicare Important Message give by:     If discussed at Long Length of Stay Meetings, dates discussed:    Additional Comments:  Marily Memos, RN 02/08/2015, 9:36 AM

## 2015-02-08 NOTE — Progress Notes (Signed)
Nutrition Follow-up    INTERVENTION:   Coordination of Care: discussed nutritional poc during ICU rounds, pt not alert enough for diet advancement, no plan for NG tube at present. Awaiting palliative care consult. If aggressive care is wanted and unable to advanced diet in next few days, recommend consideration of NG/dobhoff placement for feeding   NUTRITION DIAGNOSIS:   Inadequate oral intake related to altered GI function as evidenced by NPO status. Continues  GOAL:   Patient will meet greater than or equal to 90% of their needs  MONITOR:    (Energy intake, Digestive system)  REASON FOR ASSESSMENT:   Other (Comment) (pressure ulcer stage II)    ASSESSMENT:    Pt remains lethargic, on levophed; pt with retroperitoneal hematoma, Hgb stable at present. CT abdomen with diffuse colonic diverticulosis, no diverticulitis, stomach and small bowel are decompressed per report  Diet Order:  Diet NPO time specified  Electrolyte and Renal Profile:  Recent Labs Lab 02/06/15 1423 02/07/15 0442 02/08/15 0430  BUN 63* 52* 41*  CREATININE 1.17* 1.04* 0.83  NA 139 137 137  K 4.6 4.3 4.2   Glucose Profile:   Recent Labs  03-05-15 2103  GLUCAP 77   Meds: NS at 100 ml/hr, levophed  Height:   Ht Readings from Last 1 Encounters:  03-05-15  (1.676 m)    Weight:   Wt Readings from Last 1 Encounters:  05-Mar-2015 158 lb 4.6 oz (71.8 kg)    Ideal Body Weight:     BMI:  Body mass index is 25.56 kg/(m^2).  Estimated Nutritional Needs:   Kcal:  9147-8295 kcals (BEE 1165, 1.2 AF, 1.1-1.3 IF)   Protein:  71-86 g (1.0-1.2 g/kg)   Fluid:  1795-2160 mL (25-30 ml/kg)   EDUCATION NEEDS:   No education needs identified at this time  HIGH Care Level  Romelle Starcher MS, RD, LDN [phone] Pager

## 2015-02-08 NOTE — Progress Notes (Signed)
Inpatient Diabetes Program Recommendations  AACE/ADA: New Consensus Statement on Inpatient Glycemic Control (2015)  Target Ranges:  Prepandial:   less than 140 mg/dL      [facility] postprandial:   less than 180 mg/dL (1-2 hours)      Critically ill patients:  140 - 180 mg/dL    Results for CONI, HOMESLEY (MRN [id number]) as of 02/08/2015 11:28  Ref. Range 02/06/2015 14:23 02/07/2015 04:42 02/08/2015 04:30  Glucose Latest Ref Range: 65-99 mg/dL 409 (H) 811 (H) 914 (H)    Admit with: Shock/ Retroperitoneal Hematoma  History: DM, Dementia, HTN, CABG  Home DM Meds: None  Current DM Orders: None     MD- If within goals of care for this patient, please consider checking CBGs Q4 hours and cover with Novolog Sensitive SSI (0-9 units) Q4 hours if CBGs are elevated     Will follow Ambrose Finland RN, MSN, CDE Diabetes Coordinator Inpatient Glycemic Control Team Team Pager: [phone] (8a-5p)

## 2015-02-08 NOTE — Progress Notes (Signed)
*  PRELIMINARY RESULTS* Echocardiogram 2D Echocardiogram has been performed.  Georgann Housekeeper Hege 02/08/2015, 3:00 PM

## 2015-02-08 NOTE — Clinical Documentation Improvement (Signed)
Hospitalist  Can the diagnosis of anemia be further specified?   Iron deficiency Anemia  Nutritional anemia, including the nutrition or mineral deficits  Acute Blood Loss Anemia, including the suspected or known cause  Acute Blood Loss Anemia on Chronic Anemia, including the suspected or known cause  Anemia of chronic disease, including the associated chronic disease state  Other  Clinically Undetermined  Document any associated diagnoses/conditions.   Supporting Information: Patient with retroperitoneal hematomas per 9/19 progress notes. Tranfused with 1 unit PRBC on 02/06/15.  Labs: H/H: 9/19:  10.2/30.8. 9/17:   7.7/24.3.   Please exercise your independent, professional judgment when responding. A specific answer is not anticipated or expected.   Thank Sabino Donovan [employer] Information Management [place] [phone]

## 2015-02-08 NOTE — Progress Notes (Signed)
Patient resting comfortably overnight, remains on levophed at and maintaining blood pressure WDL, patient is lethargic but answers to name and follows simple commands.  Urine output is 77ml/hr see CHL for further assessment information. Will continue to assess for changes/need.

## 2015-02-08 NOTE — Clinical Social Work Note (Signed)
Patient is a resident of [facility] and has a Palliative Care consult pending. CSW to follow up with family. York Spaniel MSW,LCSW [phone]

## 2015-02-08 NOTE — Progress Notes (Signed)
[facility] - [place] at [facility]   PATIENT NAME: Stephanie Salas    MR#:  [id number]  DATE OF BIRTH:  [birth year]  SUBJECTIVE:  CHIEF COMPLAINT:   Chief Complaint  Patient presents with  . Altered Mental Status   Open eyes to voice. Does not follow commands. Does not answer questions. More lethargic than previous.  REVIEW OF SYSTEMS:   ROS unable to obtain due to patient's altered mental status  DRUG ALLERGIES:   Allergies  Allergen Reactions  . Penicillins   . Pravastatin     VITALS:  Blood pressure 111/52, pulse 97, temperature 98.3 F (36.8 C), temperature source Axillary, resp. rate 18, height  (1.676 m), weight 71.8 kg (158 lb 4.6 oz), SpO2 100 %.  PHYSICAL EXAMINATION:  GENERAL:  81 y.o.-year-old patient lying in the bed critically ill appearing, eyes closed EYES: Does open eyes to voice, Pupils equal, round, reactive to light and accommodation. No scleral icterus.  HEENT: Head atraumatic, normocephalic. Oropharynx and nasopharynx clear. Mucous membranes dry NECK:  Supple, no jugular venous distention. No thyroid enlargement, no tenderness.  LUNGS: Shallow respirations, scattered crackles, fair air movement, no rhonchi or rales CARDIOVASCULAR: S1, S2 normal. 4/6 systolic ejection murmur no rubs, or gallops.  ABDOMEN: Soft, nondistended. Bowel sounds decreased. No organomegaly or mass. guarding with palpation of the abdomen EXTREMITIES: No pedal edema, cyanosis, or clubbing. Both legs in soft float boots NEUROLOGIC: Opens eyes to voice, otherwise does not participate in neurologic examination SKIN: Stage II pressor ulcer.    LABORATORY PANEL:   CBC  Recent Labs Lab 02/08/15 0430  WBC 14.2*  HGB 10.2*  HCT 30.8*  PLT 150   ------------------------------------------------------------------------------------------------------------------  Chemistries   Recent Labs Lab 01/28/2015 1220  02/08/15 0430  NA 147*  < > 137  K  5.1  < > 4.2  CL 117*  < > 110  CO2 23  < > 22  GLUCOSE 98  < > 196*  BUN 63*  < > 41*  CREATININE 1.47*  < > 0.83  CALCIUM 7.8*  < > 7.6*  AST 51*  --   --   ALT 36  --   --   ALKPHOS 80  --   --   BILITOT 0.7  --   --   < > = values in this interval not displayed. ------------------------------------------------------------------------------------------------------------------  Cardiac Enzymes  Recent Labs Lab 02/06/15 1748  TROPONINI 0.23*   ------------------------------------------------------------------------------------------------------------------  RADIOLOGY:    EKG:   Orders placed or performed during the hospital encounter of 01/28/2015  . EKG 12-Lead  . EKG 12-Lead    ASSESSMENT AND PLAN:   Principal Problem:   Retroperitoneal hematoma Active Problems:   Pressure ulcer  #1 hemodynamic shock due to retroperitoneal hematoma:  - Critical care service is following  - Continues to require Levophed at 27, titrating down but not coming off - IV fluids discontinued as she is becoming edematous - Due to persistent low blood pressure will obtain 2-D echocardiogram.  - Blood cultures remain negative. DC meropenem today   #2 retroperitoneal hematomas measuring 10 x 5 and 8 x 5 cm seen on CT scan - 1 unit packed red blood cells received on 9/17, hemoglobin stable at 10 - Holding aspirin and Plavix - Coags are normal  #3 acute renal failure due to ATN, hypovolemia: Resolved - Baseline creatinine is 1.1, now 1.0 after volume replacement   #4 hyperkalemia: Resolved - Stable, still elevated, likely due to  renal failure  #5 elevated troponin - Likely due to demand ischemia in the setting of hemodynamic shock, trending down - Check 2-D echocardiogram today  #6 history of CVA/dementia - Continues to be fairly encephalopathic, - at baseline she does not walk, does not recognize family, somewhat verbal  #7 acute anemia due to blood loss, retroperitoneal  hematoma - Hemoglobin stable for 24 hours  #8 disposition: At baseline patient has multiple severe comorbidities. She is still requiring pressor agents to maintain map greater than 65. We are attempting to wean off of pressors. Bleeding seems to have stabilized. Palliative care consultation has been placed. The family should be here around noon. Overall prognosis is poor.  CODE STATUS: DNR  TOTAL CRITICAL CARE TIME TAKING CARE OF THIS PATIENT: 45 minutes.  Greater than 50% of time spent in care coordination and counseling. Plan of care discussed with the patient's daughter Stephanie Salas and also with pulmonology critical care.   Elby Showers M.D on 02/08/2015 at 9:42 AM  Between 7am to 6pm - Pager - [phone]  After 6pm go to [email or web address] - password [email or web address] [facility]  [facility] [facility]  Office  [phone]  CC: Primary care physician; No primary care provider on file.

## 2015-02-08 NOTE — Care Management Important Message (Signed)
Important Message  Patient Details  Name: Stephanie Salas MRN: [id number] Date of Birth: [birth year]   Medicare Important Message Given:  Yes-second notification given    Verita Schneiders Allmond 02/08/2015, 2:23 PM

## 2015-02-08 NOTE — Consult Note (Addendum)
[facility] Pulmonary Medicine Consultation      Name: JOI LEYVA MRN: [id number] DOB: 1924/04/19    ADMISSION DATE:  2015-02-22  ASSESSMENT/PLAN   81 yo AAF with acute mental status/encephlopathy with acute abd pain with acute hypovolumic shock from acute retro-pertioneal hematomas  PULMONARY Oxygen as needed  CARDIOVASCULAR -Hypovolumic shock-IVF's,  -Will give bolus of saline and increase IVF.  -follow h/h, transfuse as needed, wean down levophed.   RENAL -ARF from ATN -follow chem 7 Follow UO  GASTROINTESTINAL Acute retro perotoneal hematoma, was on plavix at home. Hb now stable.  -keep NPO for now   HEMATOLOGIC Follow h/h transfuse as needed  INFECTIOUS -no source at this time   ENDOCRINE -Follow fsbs  NEUROLOGIC encephlopathy from shock/acidosis -avoid sedatives  Palliative care consulted, discussed with critical care RN, hospitalist, pharmacy, nutrition.  Pt would appropriate for hospice.    CHIEF COMPLAINT:     Mental status changes and low blood pressure  HISTORY OF PRESENT ILLNESS    Remains  encephalopathic and lethargic. On levaphed infusion, wean vasopressors as tolerated Patient given 1 unit PRBC yesterday, follow h/h central line placed for vasorpessor therapy, Patient is DNR/DNI       PAST MEDICAL HISTORY    :  Past Medical History  Diagnosis Date  . Hypertension   . Diabetes mellitus without complication   . Dementia   . Recurrent UTI   . Hx of CABG   . History of CVA (cerebrovascular accident)   . Gout    History reviewed. No pertinent past surgical history. Prior to Admission medications   Medication Sig Start Date End Date Taking? Authorizing Provider  acetaminophen (TYLENOL) 650 MG suppository Place 650 mg rectally every 4 (four) hours as needed.   Yes Historical Provider, MD  albuterol (PROVENTIL) (2.5 MG/3ML) 0.083% nebulizer solution Take 2.5 mg by nebulization every 2 (two) hours as needed for wheezing  or shortness of breath.   Yes Historical Provider, MD  Amino Acids-Protein Hydrolys (FEEDING SUPPLEMENT, PRO-STAT SUGAR FREE 64,) LIQD Take 30 mLs by mouth 2 (two) times daily.   Yes Historical Provider, MD  amLODipine (NORVASC) 2.5 MG tablet Take 2.5 mg by mouth daily.   Yes Historical Provider, MD  aspirin EC 81 MG tablet Take 81 mg by mouth daily.   Yes Historical Provider, MD  clopidogrel (PLAVIX) 75 MG tablet Take 75 mg by mouth daily.   Yes Historical Provider, MD  colchicine 0.6 MG tablet Take 0.6 mg by mouth 4 (four) times daily.   Yes Historical Provider, MD  donepezil (ARICEPT) 5 MG tablet Take 5 mg by mouth at bedtime.   Yes Historical Provider, MD  ergocalciferol (VITAMIN D2) 50000 UNITS capsule Take 50,000 Units by mouth every 30 (thirty) days.   Yes Historical Provider, MD  ertapenem [facility]) 1 G SOLR injection Inject 1 g into the muscle at bedtime. 02/03/15 02/22/15 Yes Historical Provider, MD  lisinopril (PRINIVIL,ZESTRIL) 20 MG tablet Take 20 mg by mouth daily.   Yes Historical Provider, MD  memantine (NAMENDA) 10 MG tablet Take 10 mg by mouth 2 (two) times daily.   Yes Historical Provider, MD  metoprolol tartrate (LOPRESSOR) 25 MG tablet Take 25 mg by mouth 2 (two) times daily.   Yes Historical Provider, MD  Multiple Vitamin (MULTIVITAMIN) tablet Take 1 tablet by mouth daily.   Yes Historical Provider, MD  polyethylene glycol (MIRALAX / GLYCOLAX) packet Take 17 g by mouth daily.   Yes Historical Provider, MD  potassium  chloride in sodium chloride 0.45 % 1,000 mL Inject 1 Dose into the vein continuous.   Yes Historical Provider, MD  potassium chloride SA (K-DUR,KLOR-CON) 20 MEQ tablet Take 20 mEq by mouth daily.   Yes Historical Provider, MD   Allergies  Allergen Reactions  . Penicillins   . Pravastatin      FAMILY HISTORY   Family History  Problem Relation Age of Onset  . Prostate cancer Father       SOCIAL HISTORY    reports that she has never smoked. She has never  used smokeless tobacco. She reports that she does not drink alcohol. Her drug history is not on file.  Review of Systems  Unable to perform ROS: critical illness      VITAL SIGNS    Temp:  [97.5 F (36.4 C)-98.4 F (36.9 C)] 98.3 F (36.8 C) (09/19 0731) Pulse Rate:  [89-101] 94 (09/19 0600) Resp:  [18-30] 18 (09/19 0600) BP: (95-117)/(43-57) 112/50 mmHg (09/19 0600) SpO2:  [79 %-100 %] 88 % (09/19 0600) HEMODYNAMICS: CVP:  [5 mmHg-11 mmHg] 8 mmHg VENTILATOR SETTINGS:   INTAKE / OUTPUT:  Intake/Output Summary (Last 24 hours) at 02/08/15 0835 Last data filed at 02/08/15 0400  Gross per 24 hour  Intake 1975.61 ml  Output    850 ml  Net 1125.61 ml       PHYSICAL EXAM   Physical Exam  GENERAL:AAF, lying in the bed encephalopathic/lethargic and critically ill appearing.  EYES: Pupils equal, round, reactive to light and accommodation. No scleral icterus.  HEENT: Head atraumatic, normocephalic. Dry oral mucosa NECK: Supple,  LUNGS: Normal breath sounds bilaterally, no wheezing, rales, rhonchi. No use of accessory muscles of respiration.  CARDIOVASCULAR: S1, S2 RRR. No murmurs, ABDOMEN: Soft, tender diffusely, no rebound, rigidity. nondistended. Bowel sounds hypoactive.  EXTREMITIES: No pedal edema, cyanosis, or clubbing. + 2 pedal & radial pulses b/l.  NEUROLOGIC: Difficult to do a full neurological exam. Patient grimaces to pain. Globally weak. PSYCHIATRIC: Encephalopathic/lethargic. SKIN: No obvious rash, lesion, or ulcer.     LABS   LABS:  CBC  Recent Labs Lab 02/06/15 1423 02/07/15 0442 02/08/15 0430  WBC 8.6 11.2* 14.2*  HGB 7.7* 10.0* 10.2*  HCT 24.3* 30.9* 30.8*  PLT 175 170 150   Coag's  Recent Labs Lab 02/06/15 1423  INR 1.32   BMET  Recent Labs Lab 02/06/15 1423 02/07/15 0442 02/08/15 0430  NA 139 137 137  K 4.6 4.3 4.2  CL 110 109 110  CO2 BUN 63* 52* 41*  CREATININE 1.17* 1.04* 0.83  GLUCOSE 277* 237*  196*   Electrolytes  Recent Labs Lab 02/06/15 1423 02/07/15 0442 02/08/15 0430  CALCIUM 7.6* 7.6* 7.6*   Sepsis Markers  Recent Labs Lab 02/14/2015 1247 01/31/2015 1640 02/08/2015 2228  LATICACIDVEN 2.3* 2.0 1.4   ABG  Recent Labs Lab 02/04/2015 1630  PHART 7.35  PCO2ART 40  PO2ART 105   Liver Enzymes  Recent Labs Lab 01/24/2015 1220  AST 51*  ALT 36  ALKPHOS 80  BILITOT 0.7  ALBUMIN 1.5*   Cardiac Enzymes  Recent Labs Lab 02/06/15 0242 02/06/15 1130 02/06/15 1748  TROPONINI 0.51* 0.33* 0.23*   Glucose  Recent Labs Lab 01/28/2015 2103  GLUCAP 77     Recent Results (from the past 240 hour(s))  Urine culture     Status: None   Collection Time: 02/12/2015 12:47 PM  Result Value Ref Range Status   Specimen Description URINE, CLEAN  CATCH  Final   Special Requests Normal  Final   Culture NO GROWTH 2 DAYS  Final   Report Status 02/07/2015 FINAL  Final  Culture, blood (routine x 2)     Status: None (Preliminary result)   Collection Time: 02/09/2015 12:47 PM  Result Value Ref Range Status   Specimen Description BLOOD RIGHT HAND  Final   Special Requests BOTTLES DRAWN AEROBIC AND ANAEROBIC  3CC  Final   Culture NO GROWTH 3 DAYS  Final   Report Status PENDING  Incomplete  Culture, blood (routine x 2)     Status: None (Preliminary result)   Collection Time: 01/28/2015  1:19 PM  Result Value Ref Range Status   Specimen Description BLOOD LEFT HAND  Final   Special Requests   Final    BOTTLES DRAWN AEROBIC AND ANAEROBIC 1CC AEROBIC,1CC ANAEROBIC   Culture NO GROWTH 3 DAYS  Final   Report Status PENDING  Incomplete  MRSA PCR Screening     Status: None   Collection Time: 02/03/2015  8:39 PM  Result Value Ref Range Status   MRSA by PCR NEGATIVE NEGATIVE Final    Comment:        The GeneXpert MRSA Assay (FDA approved for NASAL specimens only), is one component of a comprehensive MRSA colonization surveillance program. It is not intended to diagnose MRSA infection  nor to guide or monitor treatment for MRSA infections.      Current facility-administered medications:  .  acetaminophen (TYLENOL) tablet 650 mg, 650 mg, Oral, Q6H PRN **OR** acetaminophen (TYLENOL) suppository 650 mg, 650 mg, Rectal, Q6H PRN, Houston Siren, MD .  antiseptic oral rinse (CPC / CETYLPYRIDINIUM CHLORIDE 0.05%) solution 7 mL, 7 mL, Mouth Rinse, q12n4p, Gale Journey, MD, 7 mL at 02/07/15 1600 .  chlorhexidine (PERIDEX) 0.12 % solution 15 mL, 15 mL, Mouth Rinse, BID, Gale Journey, MD, 15 mL at 02/07/15 2128 .  meropenem (MERREM) 1 g in sodium chloride 0.9 % 100 mL IVPB, 1 g, Intravenous, Q12H, Houston Siren, MD, 1 g at 02/08/15 0552 .  norepinephrine (LEVOPHED) 16 mg in dextrose 5 % 250 mL (0.064 mg/mL) infusion, 0-40 mcg/min, Intravenous, Titrated, Gale Journey, MD, Last Rate: 25.8 mL/hr at 02/07/15 2327, 27.5 mcg/min at 02/07/15 2327 .  ondansetron (ZOFRAN) tablet 4 mg, 4 mg, Oral, Q6H PRN **OR** ondansetron (ZOFRAN) injection 4 mg, 4 mg, Intravenous, Q6H PRN, Houston Siren, MD .  vancomycin (VANCOCIN) 1,250 mg in sodium chloride 0.9 % 250 mL IVPB, 1,250 mg, Intravenous, Once, Houston Siren, MD .  vancomycin (VANCOCIN) IVPB 750 mg/150 ml premix, 750 mg, Intravenous, Q24H, Houston Siren, MD, 750 mg at 02/07/15 1609  IMAGING   CT abd shows RETRO PERITONEAL hematomas     Indwelling Urinary Catheter continued, requirement due to   Reason to continue Indwelling Urinary Catheter for strict Intake/Output monitoring for hemodynamic instability   Central Line continued, requirement due to   Reason to continue [employer] Monitoring of central venous pressure or other hemodynamic parameters   MAJOR EVENTS/TEST RESULTS:  ICU admission 9/16 for shock 9/16 CT abd pelvis notes intra abd hematoma 9/17 1 unit prbc given 9/18 remains on vasopressors   INDWELLING DEVICES:: Left IJ CVL>>9/16  MICRO DATA: MRSA PCR  Urine  Blood Resp        Deep  Nicholos Johns, M.D.  [facility]    Critical Care Attestation.  I have personally obtained a history, examined  the patient, evaluated laboratory and imaging results, formulated the assessment and plan and placed orders. The Patient requires high complexity decision making for assessment and support, frequent evaluation and titration of therapies, application of advanced monitoring technologies and extensive interpretation of multiple databases. The patient has critical illness that could lead imminently to failure of 1 or more organ systems and requires the highest level of physician preparedness to intervene.  Critical Care Time devoted to patient care services described in this note is 35 minutes and is exclusive of time spent in procedures.

## 2015-02-09 DIAGNOSIS — R600 Localized edema: Secondary | ICD-10-CM

## 2015-02-09 DIAGNOSIS — N178 Other acute kidney failure: Secondary | ICD-10-CM

## 2015-02-09 DIAGNOSIS — R531 Weakness: Secondary | ICD-10-CM

## 2015-02-09 DIAGNOSIS — D72829 Elevated white blood cell count, unspecified: Secondary | ICD-10-CM

## 2015-02-09 DIAGNOSIS — R578 Other shock: Secondary | ICD-10-CM

## 2015-02-09 DIAGNOSIS — E43 Unspecified severe protein-calorie malnutrition: Secondary | ICD-10-CM

## 2015-02-09 DIAGNOSIS — G9341 Metabolic encephalopathy: Secondary | ICD-10-CM

## 2015-02-09 DIAGNOSIS — D62 Acute posthemorrhagic anemia: Secondary | ICD-10-CM

## 2015-02-09 DIAGNOSIS — Z7901 Long term (current) use of anticoagulants: Secondary | ICD-10-CM

## 2015-02-09 DIAGNOSIS — R630 Anorexia: Secondary | ICD-10-CM

## 2015-02-09 LAB — BASIC METABOLIC PANEL
ANION GAP: 7 (ref 5–15)
BUN: 31 mg/dL — ABNORMAL HIGH (ref 6–20)
CO2: 19 mmol/L — ABNORMAL LOW (ref 22–32)
Calcium: 7.5 mg/dL — ABNORMAL LOW (ref 8.9–10.3)
Chloride: 113 mmol/L — ABNORMAL HIGH (ref 101–111)
Creatinine, Ser: 0.6 mg/dL (ref 0.44–1.00)
Glucose, Bld: 150 mg/dL — ABNORMAL HIGH (ref 65–99)
POTASSIUM: 3.9 mmol/L (ref 3.5–5.1)
SODIUM: 139 mmol/L (ref 135–145)

## 2015-02-09 LAB — GLUCOSE, CAPILLARY
GLUCOSE-CAPILLARY: 132 mg/dL — AB (ref 65–99)
GLUCOSE-CAPILLARY: 120 mg/dL — AB (ref 65–99)
Glucose-Capillary: 139 mg/dL — ABNORMAL HIGH (ref 65–99)
Glucose-Capillary: 132 mg/dL — ABNORMAL HIGH (ref 65–99)

## 2015-02-09 LAB — CBC
HCT: 28.8 % — ABNORMAL LOW (ref 35.0–47.0)
Hemoglobin: 9.3 g/dL — ABNORMAL LOW (ref 12.0–16.0)
MCH: 26.5 pg (ref 26.0–34.0)
MCHC: 32.4 g/dL (ref 32.0–36.0)
MCV: 81.8 fL (ref 80.0–100.0)
PLATELETS: 127 10*3/uL — AB (ref 150–440)
RBC: 3.51 MIL/uL — AB (ref 3.80–5.20)
RDW: 16 % — ABNORMAL HIGH (ref 11.5–14.5)
WBC: 14.1 10*3/uL — AB (ref 3.6–11.0)

## 2015-02-09 MED ORDER — SENNOSIDES-DOCUSATE SODIUM 8.6-50 MG PO TABS
1.0000 | ORAL_TABLET | Freq: Two times a day (BID) | ORAL | Status: DC
  Filled 2015-02-09 (×2): qty 1

## 2015-02-09 MED ORDER — SODIUM CHLORIDE 0.9 % IV BOLUS (SEPSIS)
500.0000 mL | Freq: Once | INTRAVENOUS | Status: AC
  Administered 2015-02-09: 500 mL via INTRAVENOUS

## 2015-02-09 MED ORDER — MORPHINE SULFATE (PF) 2 MG/ML IV SOLN
2.0000 mg | INTRAVENOUS | Status: DC | PRN
  Administered 2015-02-09: 2 mg via INTRAVENOUS
  Filled 2015-02-09 (×2): qty 1

## 2015-02-09 MED ORDER — PNEUMOCOCCAL VAC POLYVALENT 25 MCG/0.5ML IJ INJ: 0.5000 mL | INJECTION | INTRAMUSCULAR | Status: DC

## 2015-02-09 NOTE — Progress Notes (Signed)
[facility] - [place] at [facility]   PATIENT NAME: Stephanie Salas    MR#:  [id number]  DATE OF BIRTH:  1923-09-14  SUBJECTIVE:  CHIEF COMPLAINT:   Chief Complaint  Patient presents with  . Altered Mental Status   Open eyes to voice. Follows commands. Very weak. Continues on levo fed  REVIEW OF SYSTEMS:   ROS unable to obtain due to patient's altered mental status  DRUG ALLERGIES:   Allergies  Allergen Reactions  . Penicillins   . Pravastatin     VITALS:  Blood pressure 108/60, pulse 105, temperature 98.4 F (36.9 C), temperature source Axillary, resp. rate 27, height  (1.676 m), weight 71.8 kg (158 lb 4.6 oz), SpO2 96 %.  PHYSICAL EXAMINATION:  GENERAL:  81 y.o.-year-old patient lying in the bed critically ill appearing, eyes closed EYES: Does open eyes to voice, Pupils equal, round, reactive to light and accommodation. No scleral icterus.  HEENT: Head atraumatic, normocephalic. Oropharynx and nasopharynx clear. Mucous membranes dry NECK:  Supple, no jugular venous distention. No thyroid enlargement, no tenderness.  LUNGS: Shallow respirations, scattered crackles, fair air movement, no rhonchi or rales CARDIOVASCULAR: S1, S2 normal. 4/6 systolic ejection murmur no rubs, or gallops.  ABDOMEN: Soft, nondistended. Bowel sounds decreased. No organomegaly or mass. guarding with palpation of the abdomen EXTREMITIES: +1 pedal edema, cyanosis, or clubbing. Both legs in soft float boots NEUROLOGIC: Opens eyes to voice, otherwise does not participate in neurologic examination SKIN: Stage II pressor ulcer.    LABORATORY PANEL:   CBC  Recent Labs Lab 02/09/15 0543  WBC 14.1*  HGB 9.3*  HCT 28.8*  PLT 127*   ------------------------------------------------------------------------------------------------------------------  Chemistries   Recent Labs Lab 02/10/2015 1220  02/09/15 0543  NA 147*  < > 139  K 5.1  < > 3.9  CL 117*  < > 113*   CO2 23  < > 19*  GLUCOSE 98  < > 150*  BUN 63*  < > 31*  CREATININE 1.47*  < > 0.60  CALCIUM 7.8*  < > 7.5*  AST 51*  --   --   ALT 36  --   --   ALKPHOS 80  --   --   BILITOT 0.7  --   --   < > = values in this interval not displayed. ------------------------------------------------------------------------------------------------------------------  Cardiac Enzymes  Recent Labs Lab 02/06/15 1748  TROPONINI 0.23*   ------------------------------------------------------------------------------------------------------------------  RADIOLOGY:    EKG:   Orders placed or performed during the hospital encounter of 01/31/2015  . EKG 12-Lead  . EKG 12-Lead    ASSESSMENT AND PLAN:   Principal Problem:   Retroperitoneal hematoma Active Problems:   Pressure ulcer  #1 hemodynamic shock due to retroperitoneal hematoma:  - Critical care service is following  - Continues to require Levophed, titrating down but not coming off easily - IV fluids, will need to decrease rate due to edema and congestive heart failure  #2 retroperitoneal hematomas measuring 10 x 5 and 8 x 5 cm seen on CT scan - 1 unit packed red blood cells received on 9/17, hemoglobin decreased slightly today, no bleeding noted - Holding aspirin and Plavix - Coags are normal  #3 acute renal failure due to ATN, hypovolemia: Resolved - Baseline creatinine is 1.1   #4 hyperkalemia: Resolved  #5 elevated troponin - Likely due to demand ischemia in the setting of hemodynamic shock, trending down  #6 history of CVA/dementia - at baseline she does not  walk, does not recognize family, somewhat verbal  #7 acute anemia due to blood loss, retroperitoneal hematoma - Hemoglobin trending downwards, continue to monitor  #8 systolic congestive heart failure -New finding of ejection fraction 30% on 2-D echocardiogram performed 9/19. This was not a previous diagnosis, though she does have a history of coronary artery disease  and likely had some ischemic damage in the past.  #9 disposition: At baseline patient has multiple severe comorbidities. She is still requiring pressor agents to maintain map greater than 65. We are attempting to wean off of pressors. Bleeding seems to have stabilized. The diagnosis of severe congestive heart failure. Palliative care consultation has been placed. I have discussed overall poor prognosis with the family and they do not seem ready at this time to transition to comfort care. I think likelihood of her recovering is extremely slim.  CODE STATUS: DNR  TOTAL CRITICAL CARE TIME TAKING CARE OF THIS PATIENT: 45 minutes.  Greater than 50% of time spent in care coordination and counseling. Plan of care discussed with the patient's daughter Ilda Basset and also with pulmonology critical care.   Elby Showers M.D on 02/09/2015 at 3:11 PM  Between 7am to 6pm - Pager - [phone]  After 6pm go to [email or web address] - password [email or web address] [facility]  [place] [facility]  Office  [phone]  CC: Primary care physician; No primary care Armari Fussell on file.

## 2015-02-09 NOTE — Progress Notes (Signed)
Nutrition Follow-up       INTERVENTION:  Meals and snacks: Discussed in ICU rounds, Dr. Ardyth Man agreeable to starting clear liquid diet with advance as tolerated.   Medical Nutrition Supplement: If unable to meet nutritional needs will add supplement   NUTRITION DIAGNOSIS:   Inadequate oral intake related to altered GI function as evidenced by NPO status.    GOAL:   Patient will meet greater than or equal to 90% of their needs    MONITOR:    (Energy intake, Digestive system)  REASON FOR ASSESSMENT:   Other (Comment) (pressure ulcer stage II)    ASSESSMENT:       Current Nutrition: NPO   Gastrointestinal Profile: Last BM: PTA   Medications: adding senokot S, NS at 164ml/hr, sliding scale insulin  Electrolyte/Renal Profile and Glucose Profile:   Recent Labs Lab 02/07/15 0442 02/08/15 0430 02/09/15 0543  NA 137 137 139  K 4.3 4.2 3.9  CL 109 110 113*  CO2 23 22 19*  BUN 52* 41* 31*  CREATININE 1.04* 0.83 0.60  CALCIUM 7.6* 7.6* 7.5*  GLUCOSE 237* 196* 150*   Protein Profile:  Recent Labs Lab 2015-02-26 1220  ALBUMIN 1.5*       Weight Trend since Admission: Filed Weights   February 26, 2015 1211 02-26-15 1900  Weight: 175 lb 14.4 oz (79.788 kg) 158 lb 4.6 oz (71.8 kg)      Diet Order:  Diet clear liquid Room service appropriate?: Yes; Fluid consistency:: Thin  Skin:   pressure ulcer stage II   Height:   Ht Readings from Last 1 Encounters:  26-Feb-2015  (1.676 m)    Weight:   Wt Readings from Last 1 Encounters:  February 26, 2015 158 lb 4.6 oz (71.8 kg)        BMI:  Body mass index is 25.56 kg/(m^2).  Estimated Nutritional Needs:   Kcal:  2956-2130 kcals (BEE 1165, 1.2 AF, 1.1-1.3 IF)   Protein:  71-86 g (1.0-1.2 g/kg)   Fluid:  1795-2160 mL (25-30 ml/kg)   EDUCATION NEEDS:   No education needs identified at this time  MODERATE Care Level  Joli B. Freida Busman, RD, LDN [phone] (pager)

## 2015-02-09 NOTE — Progress Notes (Signed)
Patient resting overnight, seems to be baseline mentation.  Answers to name and follows simple commands to open mouth for mouth care and squeeze hands when asked.  Patient titrated to room air maintaining o2 saturation.  Levophed titrated from 26.108mcg/min to 2mcg/min, patient maintaining BP WDL set by MD. Urine output remains insufficient, see CHL for further assessment data. Will continue to assess for changes/need.

## 2015-02-09 NOTE — Consult Note (Signed)
Palliative Medicine Inpatient Consult Note   Name: Stephanie Salas Date: 02/09/2015 MRN: [id number]  DOB: 1923/08/07  Referring Physician: Gale Journey, MD  Palliative Care consult requested for this 81 y.o. female for goals of medical therapy in patient with an acute retroperitoneal Hematoma with resultant shock.  She had been on plavix. She has dementia and resides in a long term care facility.    IMPRESSION: 1.  Retroperitoneal Hematoma due to acute bleeding due to sensitivity to PLAVIX.  2.  Hemodynamic shock due to acute bleed into retroperitoneum. 3.  Acute Blood Loss Anemia from hemorrhage into retroperitoneum  ---hgb now stable  4.  Anorexia --has not eaten since admission but today has food ordered to be advanced as tolerated 5.  Acute renal failure due to shock with ATN 6.  Dementia --moderate to advanced ---follows simple commands but when she speaks she is hard to understand and she is obviously confused 7.  Moderately severe cardiomyopathy with EF 30-35% ---presumed to be due to prior cardiac ischemia but no wall motion abnormalities were noted 8.  CAD with h/o CABG 9.  History of CVA ---no movement left hand ----residual is not clear 10.  Recurrent UTIs 11. DM2  12. Weakness 13. Metabolic encephalopathy due to illness 14.  Essential HTN 15.  Gout w/o flare 16.  Constipation 17.  Vitamin D Deficiency 18.  Severe Protein Calorie Malnutrition ---Albumin is 1.5 ---She does not want to eat  19.  Elevated Liver Function test (AST =51) 20.  Leukocytosis, mild, cause unclear 21.  Metabolic acidosis --resolved 22.  Hyperchloremia due to dehydration 23.  Allergic to statin drug 24.  Elevated troponin due to demand ischemia and also sepsis with acute renal failure 25.  Diffuse Diverticulosis seen on CT 26.  Peripheral edema     TODAY'S DISCUSSIONS, DECISIONS, AND PLANS:  1. Pt is DNR. Her orders from the facility also comment that she is DNI (but I don't see  those intstructions on her MOST form which is in the chart. )  2.  She has an indication on her MOST form that a FEEDING TUBE WOULD BE OK if needed. Pt needs to eat by tomorrow or she may need NG feedings, unless family alters their written wishes for a feeding tube.  The MOST form is a legal document and we can change it, but it is to be honored.  I will talk with family when they come in tomorrow.     REVIEW OF SYSTEMS:  Patient is not able to provide ROS due to confusion and critical illness  SPIRITUAL SUPPORT SYSTEM: Yes --family.  SOCIAL HISTORY:  reports that she has never smoked. She has never used smokeless tobacco. She reports that she does not drink alcohol.  LEGAL DOCUMENTS:  MOST FORM : ---states DNR BUT IVF, ABX, and FEEDING TUBES ARE ALL ALLOWED  CODE STATUS: DNR  PAST MEDICAL HISTORY: Past Medical History  Diagnosis Date  . Hypertension   . Diabetes mellitus without complication   . Dementia   . Recurrent UTI   . Hx of CABG   . History of CVA (cerebrovascular accident)   . Gout     PAST SURGICAL HISTORY: History reviewed. No pertinent past surgical history.  ALLERGIES:  is allergic to penicillins and pravastatin.  MEDICATIONS:  Current Facility-Administered Medications  Medication Dose Route Frequency Provider Last Rate Last Dose  . 0.9 %  sodium chloride infusion   Intravenous Continuous Shane Crutch, MD 100 mL/hr at 02/09/15  0308    . acetaminophen (TYLENOL) tablet 650 mg  650 mg Oral Q6H PRN Houston Siren, MD       Or  . acetaminophen (TYLENOL) suppository 650 mg  650 mg Rectal Q6H PRN Houston Siren, MD      . antiseptic oral rinse (CPC / CETYLPYRIDINIUM CHLORIDE 0.05%) solution 7 mL  7 mL Mouth Rinse q12n4p Gale Journey, MD   7 mL at 02/09/15 1321  . chlorhexidine (PERIDEX) 0.12 % solution 15 mL  15 mL Mouth Rinse BID Gale Journey, MD   15 mL at 02/09/15 0857  . insulin aspart (novoLOG) injection 0-9 Units  0-9 Units Subcutaneous  TID WC Gale Journey, MD   1 Units at 02/09/15 [phone]  . morphine 2 MG/ML injection 2 mg  2 mg Intravenous Q3H PRN Shane Crutch, MD      . norepinephrine (LEVOPHED) 16 mg in dextrose 5 % 250 mL (0.064 mg/mL) infusion  0-40 mcg/min Intravenous Titrated Gale Journey, MD 11.3 mL/hr at 02/09/15 1255 12 mcg/min at 02/09/15 1255  . ondansetron (ZOFRAN) tablet 4 mg  4 mg Oral Q6H PRN Houston Siren, MD       Or  . ondansetron (ZOFRAN) injection 4 mg  4 mg Intravenous Q6H PRN Houston Siren, MD      . senna-docusate (Senokot-S) tablet 1 tablet  1 tablet Oral BID Shane Crutch, MD   1 tablet at 02/09/15 1242    Vital Signs: BP 108/60 mmHg  Pulse 105  Temp(Src) 98.4 F (36.9 C) (Axillary)  Resp 27  Ht  (1.676 m)  Wt 71.8 kg (158 lb 4.6 oz)  BMI 25.56 kg/m2  SpO2 96% Filed Weights   02/12/2015 1211 02/07/2015 1900  Weight: 79.788 kg (175 lb 14.4 oz) 71.8 kg (158 lb 4.6 oz)    Estimated body mass index is 25.56 kg/(m^2) as calculated from the following:   Height as of this encounter:  (1.676 m).   Weight as of this encounter: 71.8 kg (158 lb 4.6 oz).  PERFORMANCE STATUS (ECOG) : 4 - Bedbound  PHYSICAL EXAM: Lying flat Verbalizing some words but not making sense Temporal wasting noted bilaterally Heart rrr no m Lungs cta anteriorly Abd with scant but present BS Ext no mottling   LABS: CBC:    Component Value Date/Time   WBC 14.1* 02/09/2015 0543   WBC 8.1 01/02/2012 0500   HGB 9.3* 02/09/2015 0543   HGB 11.5* 01/02/2012 0500   HCT 28.8* 02/09/2015 0543   HCT 34.8* 01/02/2012 0500   PLT 127* 02/09/2015 0543   PLT 143* 01/02/2012 0500   MCV 81.8 02/09/2015 0543   MCV 82 01/02/2012 0500   NEUTROABS 7.6* 02/09/2015 1220   NEUTROABS 5.6 01/02/2012 0500   LYMPHSABS 0.8* 01/28/2015 1220   LYMPHSABS 1.6 01/02/2012 0500   MONOABS 0.3 01/23/2015 1220   MONOABS 0.8 01/02/2012 0500   EOSABS 0.0 01/22/2015 1220   EOSABS 0.0 01/02/2012 0500   BASOSABS  0.0 02/12/2015 1220   BASOSABS 0.1 01/02/2012 0500   Comprehensive Metabolic Panel:    Component Value Date/Time   NA 139 02/09/2015 0543   NA 139 01/02/2012 0500   K 3.9 02/09/2015 0543   K 3.5 01/02/2012 0500   CL 113* 02/09/2015 0543   CL 101 01/02/2012 0500   CO2 19* 02/09/2015 0543   CO2 28 01/02/2012 0500   BUN 31* 02/09/2015 0543   BUN 17 01/02/2012 0500   CREATININE  0.60 02/09/2015 0543   CREATININE 1.10 01/02/2012 0500   GLUCOSE 150* 02/09/2015 0543   GLUCOSE 145* 01/02/2012 0500   CALCIUM 7.5* 02/09/2015 0543   CALCIUM 8.6 01/02/2012 0500   AST 51* 01/26/2015 1220   AST 35 01/01/2012 0109   ALT 36 01/23/2015 1220   ALT 23 01/01/2012 0109   ALKPHOS 80 01/27/2015 1220   ALKPHOS 105 01/01/2012 0109   BILITOT 0.7 Feb 11, 2015 1220   BILITOT 0.4 01/01/2012 0109   PROT 6.2* 01/26/2015 1220   PROT 7.8 01/01/2012 0109   ALBUMIN 1.5* 02/14/2015 1220   ALBUMIN 3.3* 01/01/2012 0109   TESTS:  CT abd / pelvis wo CM Feb 11, 2015: Right retroperitoneal and extraperitoneal fluid collections/ soft tissue most compatible with retroperitoneal hematoma. Cholelithiasis.  Mild cardiomegaly. Atelectasis or scarring in the lung bases.  More than 50% of the visit was spent in counseling/coordination of care: Yes  Time Spent: 70 minutes

## 2015-02-09 NOTE — Care Management (Signed)
Per RN Misty Stanley request Called and spoke with Lonzo Cloud RN at [facility].  Confirmed with RN that patient was on mechanical soft diet, no salt, regular fluids.  Confirmed with RN that hospice has not been involved in patient care. Updated patients RN Misty Stanley.

## 2015-02-09 NOTE — Progress Notes (Signed)
[facility]  Critical Care Medicine Progess Note    ASSESSMENT/PLAN  81 yo AAF with acute mental status/encephlopathy with acute abd pain with acute hypovolumic shock from acute retro-pertioneal hematomas  PULMONARY Oxygen as needed  CARDIOVASCULAR -Hypovolumic shock-IVF's,  -Continue intravenous fluids, patient has been given half liter bolus yesterday. We'll repeat today. Will wean down Levophed as tolerated. -follow h/h, transfuse as needed, wean down levophed.   RENAL -ARF from ATN -follow chem 7 Follow UO  GASTROINTESTINAL Acute retro perotoneal hematoma, was on plavix at home. Hb now stable.  -May begin diet and advance as tolerated, will start bowel regimen.   HEMATOLOGIC Follow h/h transfuse as needed  INFECTIOUS -no source at this time   ENDOCRINE -Follow fsbs  NEUROLOGIC encephlopathy from shock/acidosis -avoid sedatives  Palliative care consulted, discussed with critical care RN, hospitalist, pharmacy, nutrition.  Pt would be appropriate for hospice.    ---------------------------------------   ----------------------------------------   Name: Stephanie Salas MRN: [id number] DOB: 01/17/1924    ADMISSION DATE:  02/06/2015    CHIEF COMPLAINT:  BELLY PAIN.    STUDIES:     SUBJECTIVE:  Patient has no new complaints today.   Review of Systems:  Patient currently minimally verbal and cannot provide a review of systems.   VITAL SIGNS: Temp:  [98 F (36.7 C)-98.4 F (36.9 C)] 98 F (36.7 C) (09/19 1935) Pulse Rate:  [91-100] 98 (09/20 0000) Resp:  [18-25] 25 (09/20 0000) BP: (106-119)/(49-61) 112/52 mmHg (09/20 0000) SpO2:  [95 %-100 %] 96 % (09/20 0000) HEMODYNAMICS: CVP:  [5 mmHg-12 mmHg] 5 mmHg VENTILATOR SETTINGS:   INTAKE / OUTPUT:  Intake/Output Summary (Last 24 hours) at 02/09/15 0852 Last data filed at 02/09/15 0308  Gross per 24 hour  Intake 1539.36 ml  Output    790 ml  Net 749.36 ml    PHYSICAL  EXAMINATION: Physical Examination:   VS: BP 112/52 mmHg  Pulse 98  Temp(Src) 98 F (36.7 C) (Axillary)  Resp 25  Ht  (1.676 m)  Wt 71.8 kg (158 lb 4.6 oz)  BMI 25.56 kg/m2  SpO2 96%  General Appearance: No distress  Neuro:without focal findings, mental status normal. HEENT: PERRLA, EOM intact. Pulmonary: normal breath sounds   CardiovascularNormal S1,S2.  No m/r/g.   Abdomen: Benign, Soft, non-tender. Renal:  No costovertebral tenderness  GU:  Not performed at this time. Endocrine: No evident thyromegaly. Skin:   warm, no rashes, no ecchymosis  Extremities: normal, no cyanosis, clubbing.   LABS:   LABORATORY PANEL:   CBC  Recent Labs Lab 02/09/15 0543  WBC 14.1*  HGB 9.3*  HCT 28.8*  PLT 127*    Chemistries   Recent Labs Lab 01/22/2015 1220  02/09/15 0543  NA 147*  < > 139  K 5.1  < > 3.9  CL 117*  < > 113*  CO2 23  < > 19*  GLUCOSE 98  < > 150*  BUN 63*  < > 31*  CREATININE 1.47*  < > 0.60  CALCIUM 7.8*  < > 7.5*  AST 51*  --   --   ALT 36  --   --   ALKPHOS 80  --   --   BILITOT 0.7  --   --   < > = values in this interval not displayed.   Recent Labs Lab 02/04/2015 2103 02/08/15 1648 02/08/15 2102 02/09/15 0728  GLUCAP 77 192* 133* 132*    Recent Labs Lab 01/27/2015 1630  PHART 7.35  PCO2ART 40  PO2ART 105    Recent Labs Lab 02/04/2015 1220  AST 51*  ALT 36  ALKPHOS 80  BILITOT 0.7  ALBUMIN 1.5*    Cardiac Enzymes  Recent Labs Lab 02/06/15 1748  TROPONINI 0.23*    RADIOLOGY:  No results found.     Wells Guiles, MD.  Pager [phone] [facility] Office Number: [phone]  Santiago Glad, M.D.  Stephanie Acre, M.D.  Billy Fischer, M.D

## 2015-02-09 NOTE — Clinical Social Work Note (Signed)
Clinical Social Work Assessment  Patient Details  Name: Stephanie Salas MRN: [id number] Date of Birth: 11-15-1923  Date of referral:  02/09/15               Reason for consult:  Facility Placement                Permission sought to share information with:    Permission granted to share information::     Name::        Agency::     Relationship::     Contact Information:     Housing/Transportation Living arrangements for the past 2 months:  Skilled Building surveyor of Information:  Adult Children Patient Interpreter Needed:  None Criminal Activity/Legal Involvement Pertinent to Current Situation/Hospitalization:  No - Comment as needed Significant Relationships:  Adult Children Lives with:  Facility Resident Do you feel safe going back to the place where you live?    Need for family participation in patient care:     Care giving concerns:  Patient is a long term resident at [facility].   Social Worker assessment / plan:  CSW contacted patient's daughter, Ritta Slot [phone]), as patient is not an accurate historian and is not very alert at this time. Ms. Greer Pickerel informed CSW that patient has been at [facility] for about 3 years. When asked if she wishes for patient to return, she hesitated but then stated that yes she would want her to return. When CSW inquired further, Ms. Greer Pickerel stated that her family would like her to move patient to another facility but Ms. McKoy stated that she is not going to pursue this at this time. Fl2 placed on chart.  Employment status:  Retired Database administrator PT Recommendations:  Not assessed at this time Information / Referral to community resources:     Patient/Family's Response to care:  Patient's daughter pleasant and willing to speak with CSW.  Patient/Family's Understanding of and Emotional Response to Diagnosis, Current Treatment, and Prognosis:  Patient's daughter expressed appreciation for CSW  phone call.  Emotional Assessment Appearance:  Appears stated age Attitude/Demeanor/Rapport:  Unable to Assess Affect (typically observed):  Unable to Assess Orientation:  Fluctuating Orientation (Suspected and/or reported Sundowners) Alcohol / Substance use:  Not Applicable Psych involvement (Current and /or in the community):  No (Comment)  Discharge Needs  Concerns to be addressed:  Care Coordination Readmission within the last 30 days:  No Current discharge risk:  None Barriers to Discharge:  No Barriers Identified   York Spaniel, LCSW 02/09/2015, 1:58 PM

## 2015-02-10 ENCOUNTER — Inpatient Hospital Stay: Payer: Medicare Other

## 2015-02-10 DIAGNOSIS — R1310 Dysphagia, unspecified: Secondary | ICD-10-CM

## 2015-02-10 LAB — CULTURE, BLOOD (ROUTINE X 2)
CULTURE: NO GROWTH
Culture: NO GROWTH

## 2015-02-10 LAB — GLUCOSE, CAPILLARY
GLUCOSE-CAPILLARY: 137 mg/dL — AB (ref 65–99)
GLUCOSE-CAPILLARY: 136 mg/dL — AB (ref 65–99)
Glucose-Capillary: 125 mg/dL — ABNORMAL HIGH (ref 65–99)
Glucose-Capillary: 121 mg/dL — ABNORMAL HIGH (ref 65–99)
Glucose-Capillary: 132 mg/dL — ABNORMAL HIGH (ref 65–99)

## 2015-02-10 LAB — CBC
HCT: 28.1 % — ABNORMAL LOW (ref 35.0–47.0)
Hemoglobin: 9.2 g/dL — ABNORMAL LOW (ref 12.0–16.0)
MCH: 27.1 pg (ref 26.0–34.0)
MCHC: 32.9 g/dL (ref 32.0–36.0)
MCV: 82.5 fL (ref 80.0–100.0)
PLATELETS: 134 10*3/uL — AB (ref 150–440)
RBC: 3.41 MIL/uL — AB (ref 3.80–5.20)
RDW: 16.1 % — ABNORMAL HIGH (ref 11.5–14.5)
WBC: 12.5 10*3/uL — AB (ref 3.6–11.0)

## 2015-02-10 LAB — BASIC METABOLIC PANEL
ANION GAP: 4 — AB (ref 5–15)
BUN: 29 mg/dL — AB (ref 6–20)
CALCIUM: 7.3 mg/dL — AB (ref 8.9–10.3)
CO2: 20 mmol/L — ABNORMAL LOW (ref 22–32)
Chloride: 117 mmol/L — ABNORMAL HIGH (ref 101–111)
Creatinine, Ser: 0.62 mg/dL (ref 0.44–1.00)
GFR calc Af Amer: >60 mL/min (ref >60)
Glucose, Bld: 148 mg/dL — ABNORMAL HIGH (ref 65–99)
POTASSIUM: 4.1 mmol/L (ref 3.5–5.1)
SODIUM: 141 mmol/L (ref 135–145)

## 2015-02-10 MED ORDER — FREE WATER
25.0000 mL | Status: DC
  Administered 2015-02-10 – 2015-02-11 (×3): 25 mL

## 2015-02-10 MED ORDER — HYDROCORTISONE NA SUCCINATE PF 100 MG IJ SOLR
50.0000 mg | Freq: Three times a day (TID) | INTRAMUSCULAR | Status: DC
  Administered 2015-02-10 – 2015-02-11 (×3): 50 mg via INTRAVENOUS
  Filled 2015-02-10 (×3): qty 2

## 2015-02-10 MED ORDER — PNEUMOCOCCAL VAC POLYVALENT 25 MCG/0.5ML IJ INJ: 0.5000 mL | INJECTION | INTRAMUSCULAR | Status: DC | PRN

## 2015-02-10 MED ORDER — BISACODYL 5 MG PO TBEC: 5.0000 mg | DELAYED_RELEASE_TABLET | Freq: Every day | ORAL | Status: DC | PRN

## 2015-02-10 MED ORDER — SODIUM CHLORIDE 0.9 % IV BOLUS (SEPSIS)
500.0000 mL | Freq: Once | INTRAVENOUS | Status: AC
  Administered 2015-02-10: 500 mL via INTRAVENOUS

## 2015-02-10 MED ORDER — VITAL HIGH PROTEIN PO LIQD: 1000.0000 mL | ORAL | Status: DC

## 2015-02-10 MED ORDER — GLUCERNA 1.2 CAL PO LIQD
1000.0000 mL | ORAL | Status: DC
  Administered 2015-02-10: 1000 mL

## 2015-02-10 NOTE — Progress Notes (Signed)
Nutrition Follow-up      INTERVENTION:  EN: Consulted to start enteral nutrition via dobhoff tube.  Recommend  glucerna 1.2 at goal rate of 46ml/hr. Will provide 1728 kcals, 86 g of protein, free water.  Recommend additional free water flush of 25ml q 4 hr at this time.  NUTRITION DIAGNOSIS:   Inadequate oral intake related to altered GI function as evidenced by NPO status being addressed at starting tube feeding    GOAL:   Patient will meet greater than or equal to 90% of their needs    MONITOR:    (Energy intake, Digestive system)  REASON FOR ASSESSMENT:   Other (Comment) (pressure ulcer stage II)    ASSESSMENT:      Pt with retroperitoneal hematoma, ARF, CHF with EF of 30-35%, acute anemia. Pallative care consulted   Current Nutrition: taking few sips of liquids this am, limited intake day 5 of admission   Gastrointestinal Profile:obese, soft abdomen hypoactive bowel sounds Last BM: PTA   Medications: levophed, IV fluids stopped today  Electrolyte/Renal Profile and Glucose Profile:   Recent Labs Lab 02/08/15 0430 02/09/15 0543 02/10/15 0359  NA 137 139 141  K 4.2 3.9 4.1  CL 110 113* 117*  CO2 22 19* 20*  BUN 41* 31* 29*  CREATININE 0.83 0.60 0.62  CALCIUM 7.6* 7.5* 7.3*  GLUCOSE 196* 150* 148*   Protein Profile:  Recent Labs Lab 2015-02-22 1220  ALBUMIN 1.5*     Nutrition-Focused Physical Exam Findings: Nutrition-Focused physical exam completed. Findings are no fat depletion, no muscle depletion, and unable to assess edema.      Weight Trend since Admission: Filed Weights   February 22, 2015 1211 2015/02/22 1900  Weight: 175 lb 14.4 oz (79.788 kg) 158 lb 4.6 oz (71.8 kg)      Diet Order:  Diet clear liquid Room service appropriate?: Yes; Fluid consistency:: Thin  Skin:   reviewed   Height:   Ht Readings from Last 1 Encounters:  February 22, 2015  (1.676 m)    Weight:   Wt Readings from Last 1 Encounters:  02-22-15 158 lb 4.6 oz  (71.8 kg)     BMI:  Body mass index is 25.56 kg/(m^2).  Estimated Nutritional Needs:   Kcal:  1610-9604 kcals (BEE 1165, 1.2 AF, 1.1-1.3 IF)   Protein:  71-86 g (1.0-1.2 g/kg)   Fluid:  1795-2160 mL (25-30 ml/kg)   EDUCATION NEEDS:   No education needs identified at this time  HIGH Care Level  Joli B. Freida Busman, RD, LDN [phone] (pager)

## 2015-02-10 NOTE — Care Management Important Message (Signed)
Important Message  Patient Details  Name: Stephanie Salas MRN: [id number] Date of Birth: Feb 13, 1924   Medicare Important Message Given:  Yes-third notification given    Olegario Messier A Allmond 02/10/2015, 10:21 AM

## 2015-02-10 NOTE — Care Management Note (Signed)
Case Management Note  Patient Details  Name: ADRIONA KANEY MRN: [id number] Date of Birth: [birth year]  Subjective/Objective:  Case discussed in progression. Palliative has consulted and although patient is hospice appropriate, family does not wish to pursue comfort care at this time.  She is a DNR. On levophed gtt                  Action/Plan: [facility]  Expected Discharge Date:                  Expected Discharge Plan:  Skilled Nursing Facility  In-House Referral:  Clinical Social Work  Discharge planning Services     Post Acute Care Choice:    Choice offered to:     DME Arranged:    DME Agency:     HH Arranged:    HH Agency:     Status of Service:  In process, will continue to follow  Medicare Important Message Given:  Yes-third notification given Date Medicare IM Given:    Medicare IM give by:    Date Additional Medicare IM Given:    Additional Medicare Important Message give by:     If discussed at Long Length of Stay Meetings, dates discussed:    Additional Comments:  Marily Memos, RN 02/10/2015, 11:20 AM

## 2015-02-10 NOTE — Progress Notes (Signed)
Palliative Care Update   See earlier note about productive meeting with pts daughter and granddaughter.   They seemed open to allowing the patient to take a natural course and to not place a feeding tube etc. We even discussed Hospice somewhat and since they recently had a relative die at [facility], they are not against that plan.  BUT:  They defer to the pts son, however, for decisions. AND he is not here today for me to talk with.   There is no HCPOA and given that there are two living adult children, they need to agree on plans for pt in order for [place] to follow their requests since this is not a 'majority' situation.   Unfortunately, the son lives in [place] and works in [place] daily.  AND HE HAS NO PHONE! Sister had agreed to go get him and bring him up here tonight.  I agreed to stay until 7 pm to meet with son and also daughter again to discuss a palliative approach (even Hospice Home).  BUT a message was sent here via granddaughter to 'go ahead and start to the tube feeding' per son.  And I was also told that he would not be coming up here.  So, I cannot talk with someone who has no phone and is not here.  NG feedings are to begin.  With pt's hypotension, doubt she can have a PEG placed anytime soon.  However, it seems that the son will 'want to keep her alive and have a feeding tube ' based on his sister's and niece's report of what he will want done. And they say they won't go against his wishes.    Pt is on 12 mcgs of Levo and Her BP is up a bit after steroids.  Her resp rate is down some also. But she still does not eat enough to survive.   Perhaps the son will be here tomorrow.  I hope so.  Cammie Mcgee, MD

## 2015-02-10 NOTE — Progress Notes (Signed)
[facility] [place] [facility]   PATIENT NAME: Stephanie Salas    MR#:  [id number]  DATE OF BIRTH:  Jan 17, 1924  SUBJECTIVE:  CHIEF COMPLAINT:   Chief Complaint  Patient presents with  . Altered Mental Status   Open eyes to voice. Follows commands. Very weak. Slightly more talkative today. Continues on levo fed  REVIEW OF SYSTEMS:   ROS unable to obtain due to patient's altered mental status  DRUG ALLERGIES:   Allergies  Allergen Reactions  . Penicillins   . Pravastatin     VITALS:  Blood pressure 115/62, pulse 91, temperature 97.6 F (36.4 C), temperature source Axillary, resp. rate 33, height  (1.676 m), weight 71.8 kg (158 lb 4.6 oz), SpO2 99 %.  PHYSICAL EXAMINATION:  GENERAL:  81 y.o.-year-old patient lying in the bed critically ill appearing EYES: Does open eyes to voice, Pupils equal, round, reactive to light and accommodation. No scleral icterus.  HEENT: Head atraumatic, normocephalic. Oropharynx and nasopharynx clear. Mucous membranes dry NECK:  Supple, no jugular venous distention. No thyroid enlargement, no tenderness.  LUNGS: Shallow respirations, scattered crackles, fair air movement, no rhonchi or rales CARDIOVASCULAR: S1, S2 normal. 4/6 systolic ejection murmur no rubs, or gallops.  ABDOMEN: Soft, nondistended. Bowel sounds decreased. No organomegaly or mass. guarding with palpation of the abdomen EXTREMITIES: +1 pedal edema, cyanosis, or clubbing. Both legs in soft float boots NEUROLOGIC: Opens eyes to voice, otherwise does not participate in neurologic examination SKIN: Stage II pressor ulcer.    LABORATORY PANEL:   CBC  Recent Labs Lab 02/10/15 0359  WBC 12.5*  HGB 9.2*  HCT 28.1*  PLT 134*   ------------------------------------------------------------------------------------------------------------------  Chemistries   Recent Labs Lab 02-16-2015 1220  02/10/15 0359  NA 147*  < > 141  K 5.1  < > 4.1   CL 117*  < > 117*  CO2 23  < > 20*  GLUCOSE 98  < > 148*  BUN 63*  < > 29*  CREATININE 1.47*  < > 0.62  CALCIUM 7.8*  < > 7.3*  AST 51*  --   --   ALT 36  --   --   ALKPHOS 80  --   --   BILITOT 0.7  --   --   < > = values in this interval not displayed. ------------------------------------------------------------------------------------------------------------------  Cardiac Enzymes  Recent Labs Lab 02/06/15 1748  TROPONINI 0.23*   ------------------------------------------------------------------------------------------------------------------  RADIOLOGY:    EKG:   Orders placed or performed during the hospital encounter of 2015/02/16  . EKG 12-Lead  . EKG 12-Lead    ASSESSMENT AND PLAN:   Principal Problem:   Retroperitoneal hematoma Active Problems:   Pressure ulcer  #1 hemodynamic shock due to retroperitoneal hematoma:  - Critical care service is following  - Continues to require Levophed, titrating down but not coming off easily - IV fluids, will need to decrease rate due to edema and congestive heart failure  #2 retroperitoneal hematomas measuring 10 x 5 and 8 x 5 cm seen on CT scan - 1 unit packed red blood cells received on 9/17, hemoglobin decreased slightly today, no bleeding noted - Holding aspirin and Plavix - Coags are normal  #3 acute renal failure due to ATN, hypovolemia: Resolved - Baseline creatinine is 1.1   #4 hyperkalemia: Resolved  #5 elevated troponin - Likely due to demand ischemia in the setting of hemodynamic shock, trending down  #6 history of CVA/dementia - at baseline she  does not walk, does not recognize family, somewhat verbal  #7 acute anemia due to blood loss, retroperitoneal hematoma - Hemoglobin trending downwards, continue to monitor  #8 systolic congestive heart failure -New finding of ejection fraction 30% on 2-D echocardiogram performed 9/19. This was not a previous diagnosis, though she does have a history of  coronary artery disease and likely had some ischemic damage in the past.  #9 disposition: At baseline patient has multiple severe comorbidities. She is still requiring pressor agents to maintain map greater than 65. We are attempting to wean off of pressors. Bleeding seems to have stabilized. The diagnosis of severe congestive heart failure. Palliative care consultation has been placed. I have discussed overall poor prognosis with the family and they do not seem ready at this time to transition to comfort care. I think likelihood of her recovering is extremely slim. Will discuss again with family, would be appropriate to transition to comfort care at this time.  #10 nutrition: Start tube feeds Place NG tube. This was discussed with critical care today.  CODE STATUS: DNR  TOTAL CRITICAL CARE TIME TAKING CARE OF THIS PATIENT: 45 minutes.  Greater than 50% of time spent in care coordination and counseling. Plan of care discussed with the patient's daughter Ilda Basset and also with pulmonology critical care.   Elby Showers M.D on 02/10/2015 at 4:21 PM  Between 7am to 6pm - Pager - [phone]  After 6pm go to [email or web address] - password [email or web address] [facility]  [place] [facility]  Office  [phone]  CC: Primary care physician; No primary care provider on file.

## 2015-02-10 NOTE — Progress Notes (Signed)
Palliative Care Update   Consult initiated.  Patient is not making much sense when talking.  Nurse and I attempted to communicate but she does not seem oriented.  She is very malnourished.  She has not eaten for several days.  Her MOST form was reviewed just recently by family and they made no changes.  On this form, there is indication that they would desire a feeding tube and other aggressive care, but they do not want resuscitation.  Will talk with family.  I suspect they will want an NG tube if pt does not accept food soon.  But another discussion is in order.    Full note to follow.  Suan Halter, MD

## 2015-02-10 NOTE — Progress Notes (Signed)
Pt remains on levophed drip, increased to 14 mcgs. She responds to voice, will follow simple commands. Other VS wnl. No other issues.

## 2015-02-10 NOTE — Plan of Care (Signed)
Problem: Phase I Progression Outcomes Goal: Hemodynamically stable Outcome: Not Progressing Pt remains on levophed drip, increased to 14 mcgs.

## 2015-02-10 NOTE — Progress Notes (Addendum)
[facility] Critical Care Medicine Progess Note    ASSESSMENT/PLAN  81 yo AAF with acute mental status/encephlopathy with acute abd pain with acute hypovolumic shock from acute retro-pertioneal hematomas  PULMONARY -Currently no oxygen needs.  CARDIOVASCULAR -Hypovolumic shock-IVF's,  -Continue intravenous fluids, patient has been given half liter bolus yesterday. We'll repeat today. Will wean down Levophed as tolerated. -follow h/h, transfuse as needed, wean down levophed. Can wean down pressors to a goal systolic blood pressure of greater than 90, now that the patient's kidney functions have significantly improved. If still not improved. Can consider starting Midodrin, if the patient is able to take oral medications. -Start empiric steroids.   RENAL -ARF from ATN -follow chem 7 Follow UO  GASTROINTESTINAL Acute retro perotoneal hematoma, was on plavix at home. Hb now stable.  -advance diet as tolerated, will start bowel regimen.   HEMATOLOGIC Follow h/h transfuse as needed  INFECTIOUS -no source at this time   ENDOCRINE -Follow fsbs  NEUROLOGIC encephlopathy from shock/acidosis, this now appears to be improved and the patient appears to be near baseline. -avoid sedatives  Discussed with critical care RN, pharmacy, nutrition, and palliative care. Pt would be appropriate for hospice, though the family continues to want current medical therapy with aggressive care. However, the patient is still DO NOT RESUSCITATE. She is taking minimally poorly, we will place a feeding tube for better nutrition.   ---------------------------------------   ----------------------------------------   Name: Stephanie Salas MRN: [id number] DOB: 01/14/1924    ADMISSION DATE:  02/01/2015    CHIEF COMPLAINT:  BELLY PAIN.    STUDIES:     SUBJECTIVE:  Patient has no new complaints today.   Review of Systems:  Patient currently minimally verbal and cannot provide a  review of systems.   VITAL SIGNS: Temp:  [97.6 F (36.4 C)-98.6 F (37 C)] 97.6 F (36.4 C) (09/21 0400) Pulse Rate:  [92-113] 96 (09/21 0600) Resp:  [22-38] 26 (09/21 0600) BP: (87-110)/(49-60) 93/50 mmHg (09/21 0600) SpO2:  [94 %-99 %] 98 % (09/21 0600) HEMODYNAMICS: CVP:  [7 mmHg-14 mmHg] 11 mmHg VENTILATOR SETTINGS:   INTAKE / OUTPUT:  Intake/Output Summary (Last 24 hours) at 02/10/15 0842 Last data filed at 02/10/15 0500  Gross per 24 hour  Intake 741.72 ml  Output    325 ml  Net 416.72 ml    PHYSICAL EXAMINATION: Physical Examination:   VS: BP 93/50 mmHg  Pulse 96  Temp(Src) 97.6 F (36.4 C) (Axillary)  Resp 26  Ht  (1.676 m)  Wt 71.8 kg (158 lb 4.6 oz)  BMI 25.56 kg/m2  SpO2 98%  General Appearance: No distress  Neuro:without focal findings, mental status normal. HEENT: PERRLA, EOM intact. Pulmonary: normal breath sounds   CardiovascularNormal S1,S2.  No m/r/g.   Abdomen: Benign, Soft, non-tender. Renal:  No costovertebral tenderness  GU:  Not performed at this time. Endocrine: No evident thyromegaly. Skin:   warm, no rashes, no ecchymosis  Extremities: normal, no cyanosis, clubbing.   LABS:   LABORATORY PANEL:   CBC  Recent Labs Lab 02/10/15 0359  WBC 12.5*  HGB 9.2*  HCT 28.1*  PLT 134*    Chemistries   Recent Labs Lab 02/13/2015 1220  02/10/15 0359  NA 147*  < > 141  K 5.1  < > 4.1  CL 117*  < > 117*  CO2 23  < > 20*  GLUCOSE 98  < > 148*  BUN 63*  < > 29*  CREATININE 1.47*  < >  0.62  CALCIUM 7.8*  < > 7.3*  AST 51*  --   --   ALT 36  --   --   ALKPHOS 80  --   --   BILITOT 0.7  --   --   < > = values in this interval not displayed.   Recent Labs Lab 02/08/15 2102 02/09/15 0728 02/09/15 1124 02/09/15 1655 02/09/15 2112 02/10/15 0722  GLUCAP 133* 132* 120* 139* 132* 137*    Recent Labs Lab 02/07/2015 1630  PHART 7.35  PCO2ART 40  PO2ART 105    Recent Labs Lab 02/12/2015 1220  AST 51*  ALT 36    ALKPHOS 80  BILITOT 0.7  ALBUMIN 1.5*    Cardiac Enzymes  Recent Labs Lab 02/06/15 1748  TROPONINI 0.23*    RADIOLOGY:  No results found.     --Wells Guiles, MD.  [facility]  Santiago Glad, M.D.  Stephanie Acre, M.D.  Billy Fischer, M.D  Critical Care Attestation.  I have personally obtained a history, examined the patient, evaluated laboratory and imaging results, formulated the assessment and plan and placed orders. The Patient requires high complexity decision making for assessment and support, frequent evaluation and titration of therapies, application of advanced monitoring technologies and extensive interpretation of multiple databases. The patient has critical illness that could lead imminently to failure of 1 or more organ systems and requires the highest level of physician preparedness to intervene.  Critical Care Time devoted to patient care services described in this note is 35 minutes and is exclusive of time spent in procedures.

## 2015-02-10 NOTE — Progress Notes (Signed)
Palliative Medicine Inpatient Consult Follow Up Note   Name: Stephanie Salas Date: 02/10/2015 MRN: [id number]  DOB: [birth year]  Referring Physician: Gale Journey, MD  Palliative Care consult requested for this 81 y.o. female for goals of medical therapy in patient with   IMPRESSION: 1. Retroperitoneal Hematoma due to acute bleeding due to sensitivity to PLAVIX.  2. Hemodynamic shock due to acute bleed into retroperitoneum. 3. Acute Blood Loss Anemia from hemorrhage into retroperitoneum  ---hgb now stable  4. Anorexia --has not eaten since admission but today has food ordered to be advanced as tolerated 5. Acute renal failure due to shock with ATN 6. Dementia --moderate to advanced ---follows simple commands but when she speaks she is hard to understand and she is obviously confused 7. Moderately severe cardiomyopathy with EF 30-35% ---presumed to be due to prior cardiac ischemia but no wall motion abnormalities were noted 8. CAD with h/o CABG 9. History of CVA ---no movement left hand ----residual is not clear 10. Recurrent UTIs 11. DM2  12. Weakness 13. Metabolic encephalopathy due to illness 14. Essential HTN 15. Gout w/o flare 16. Constipation 17. Vitamin D Deficiency 18. Severe Protein Calorie Malnutrition ---Albumin is 1.5 ---She does not want to eat  19. Elevated Liver Function test (AST =51) 20. Leukocytosis, mild, cause unclear 21. Metabolic acidosis --resolved 22. Hyperchloremia due to dehydration 23. Allergic to statin drug 24. Elevated troponin due to demand ischemia and also sepsis with acute renal failure 25. Diffuse Diverticulosis seen on CT 26. Peripheral edema  TODAY'S DISCUSSIONS, DECISIIONS, AND PLANS:  1. DNR status continues   2. I had a detailed discussion about goals of care with the patient's daughter and also with one granddaughter who was present this afternoon.  There is a living son who is not available at  this time either by phone or in person.  I presented all the problems that we cannot fix with a focus on pts malnutrition.  I discussed how severe malnutrition affects outcome and how this evolves in older and demented patients.  I went over loss of appetite, smell, taste, recognition of food as food, etc.  I reviewed how we cannot force food but should always offer.  There were issues surfacing during this meeting about the facility being blamed for not offering food as much as they would wish.  Pt apparently has an appetite for cakes and pies, and of course, these are empty calories and not that nourishing.  At the end of the meeting they decided to HOLD OFF ON PLACING THE ORDERED NG TUBE until the pts son can get here TONITE.    3.  Pt's son works in [place] but lives in [place] AND he does not have a phone.  His sister is going to drive to go get him and bring him here. I have stated that I will stay here until 7pm to be able to talk with him tonite.    4.  This past May, the patient's sister (and the mother of the granddaughter who is here today) died at [facility]. The family is saying 'it is too soon after that for this to be happening'.  Their grief over that loss is influencing how they view the end stage of this patient's life.  They say that the son will 'want to keep her here as long as possible'.  The daughter is somewhat open to hospice Home --they do NOT want her to go back to the same facility (even  under Hospice care) .  Pt could end up with an NG and even a PEG --but that is dependent on the son's decisions and his influence on his sister and others in the family.      REVIEW OF SYSTEMS:  Patient is not able to provide ROS due to advanced dementia and weakness  CODE STATUS: DNR   PAST MEDICAL HISTORY: Past Medical History  Diagnosis Date  . Hypertension   . Diabetes mellitus without complication   . Dementia   . Recurrent UTI   . Hx of CABG   . History of CVA  (cerebrovascular accident)   . Gout     PAST SURGICAL HISTORY: History reviewed. No pertinent past surgical history.  Vital Signs: BP 113/70 mmHg  Pulse 87  Temp(Src) 97.8 F (36.6 C) (Axillary)  Resp 24  Ht  (1.676 m)  Wt 71.8 kg (158 lb 4.6 oz)  BMI 25.56 kg/m2  SpO2 97% Filed Weights   02-10-15 1211 02-10-2015 1900  Weight: 79.788 kg (175 lb 14.4 oz) 71.8 kg (158 lb 4.6 oz)    Estimated body mass index is 25.56 kg/(m^2) as calculated from the following:   Height as of this encounter:  (1.676 m).   Weight as of this encounter: 71.8 kg (158 lb 4.6 oz).  PHYSICAL EXAM: Lying in bed --verbalizing occasional words but has not made much sense yet I witnessed staff feed her this am but pt had only minimal intake:  She ate small (tiny) spoonfuls of jello and soup --but only about 4 each.  She had visible oropharyngeal delay with swallowing.  But she did not clear her throat or cough.   EOMI OP clear Neck w/o JVD or TM Heart rrr no m Lungs with decreased BS in bases Abd soft and NT Ext she does not move left hand and there is 2plus to 3 plus edema of left hand and to some degree to left arm. Skin is warm and dry She is floridly confused  LABS: CBC:    Component Value Date/Time   WBC 12.5* 02/10/2015 0359   WBC 8.1 01/02/2012 0500   HGB 9.2* 02/10/2015 0359   HGB 11.5* 01/02/2012 0500   HCT 28.1* 02/10/2015 0359   HCT 34.8* 01/02/2012 0500   PLT 134* 02/10/2015 0359   PLT 143* 01/02/2012 0500   MCV 82.5 02/10/2015 0359   MCV 82 01/02/2012 0500   NEUTROABS 7.6* 02-10-2015 1220   NEUTROABS 5.6 01/02/2012 0500   LYMPHSABS 0.8* 2015/02/10 1220   LYMPHSABS 1.6 01/02/2012 0500   MONOABS 0.3 02/10/2015 1220   MONOABS 0.8 01/02/2012 0500   EOSABS 0.0 February 10, 2015 1220   EOSABS 0.0 01/02/2012 0500   BASOSABS 0.0 February 10, 2015 1220   BASOSABS 0.1 01/02/2012 0500   Comprehensive Metabolic Panel:    Component Value Date/Time   NA 141 02/10/2015 0359   NA 139  01/02/2012 0500   K 4.1 02/10/2015 0359   K 3.5 01/02/2012 0500   CL 117* 02/10/2015 0359   CL 101 01/02/2012 0500   CO2 20* 02/10/2015 0359   CO2 28 01/02/2012 0500   BUN 29* 02/10/2015 0359   BUN 17 01/02/2012 0500   CREATININE 0.62 02/10/2015 0359   CREATININE 1.10 01/02/2012 0500   GLUCOSE 148* 02/10/2015 0359   GLUCOSE 145* 01/02/2012 0500   CALCIUM 7.3* 02/10/2015 0359   CALCIUM 8.6 01/02/2012 0500   AST 51* February 10, 2015 1220   AST 35 01/01/2012 0109   ALT 36 02/10/2015 1220  ALT 23 01/01/2012 0109   ALKPHOS 80 2015/03/04 1220   ALKPHOS 105 01/01/2012 0109   BILITOT 0.7 04-Mar-2015 1220   BILITOT 0.4 01/01/2012 0109   PROT 6.2* 03/04/15 1220   PROT 7.8 01/01/2012 0109   ALBUMIN 1.5* 03/04/15 1220   ALBUMIN 3.3* 01/01/2012 0109     More than 50% of the visit was spent in counseling/coordination of care: YES  Time Spent:  110 min

## 2015-02-11 ENCOUNTER — Inpatient Hospital Stay: Payer: Medicare Other

## 2015-02-11 DIAGNOSIS — Z931 Gastrostomy status: Secondary | ICD-10-CM

## 2015-02-11 DIAGNOSIS — R400 Somnolence: Secondary | ICD-10-CM

## 2015-02-11 DIAGNOSIS — K661 Hemoperitoneum: Principal | ICD-10-CM

## 2015-02-11 DIAGNOSIS — E119 Type 2 diabetes mellitus without complications: Secondary | ICD-10-CM

## 2015-02-11 DIAGNOSIS — I10 Essential (primary) hypertension: Secondary | ICD-10-CM

## 2015-02-11 DIAGNOSIS — Z66 Do not resuscitate: Secondary | ICD-10-CM

## 2015-02-11 DIAGNOSIS — I469 Cardiac arrest, cause unspecified: Secondary | ICD-10-CM

## 2015-02-11 DIAGNOSIS — F0390 Unspecified dementia without behavioral disturbance: Secondary | ICD-10-CM

## 2015-02-11 DIAGNOSIS — Z515 Encounter for palliative care: Secondary | ICD-10-CM

## 2015-02-11 DIAGNOSIS — Z8673 Personal history of transient ischemic attack (TIA), and cerebral infarction without residual deficits: Secondary | ICD-10-CM

## 2015-02-11 DIAGNOSIS — I429 Cardiomyopathy, unspecified: Secondary | ICD-10-CM

## 2015-02-11 LAB — GLUCOSE, CAPILLARY
GLUCOSE-CAPILLARY: 161 mg/dL — AB (ref 65–99)
GLUCOSE-CAPILLARY: 164 mg/dL — AB (ref 65–99)
GLUCOSE-CAPILLARY: 167 mg/dL — AB (ref 65–99)

## 2015-02-20 NOTE — Clinical Social Work Note (Signed)
Patient expired this morning. [employer] notified. York Spaniel MSW,LCSW [phone]

## 2015-02-20 NOTE — Discharge Summary (Signed)
[facility] - [place] at [facility]  DISCHARGE SUMMARY   PATIENT NAME: Stephanie Salas    MR#:  [id number]  DATE OF BIRTH:  Dec 16, 1923  DATE OF ADMISSION:  01/21/2015 ADMITTING PHYSICIAN: Houston Siren, MD  DATE OF Death: 02/24/2015  PRIMARY CARE PHYSICIAN: No primary care provider on file.    ADMISSION DIAGNOSIS:  Dehydration [E86.0] Weakness [R53.1] Cardiac enzymes elevated [R74.8] Abdominal pain [R10.9]  DISCHARGE DIAGNOSIS:  Principal Problem:   Retroperitoneal hematoma Active Problems:   Pressure ulcer   SECONDARY DIAGNOSIS:   Past Medical History  Diagnosis Date  . Hypertension   . Diabetes mellitus without complication   . Dementia   . Recurrent UTI   . Hx of CABG   . History of CVA (cerebrovascular accident)   . Gout     HOSPITAL COURSE:   #1 hemodynamic shock due to retroperitoneal hematoma: She was admitted to the ICU and was treated with IV Levaquin at throughout the duration of her hospitalization. We were unable to wean her off of this medication. #2 acute anemia due to retroperitoneal hematoma and bleeding. Patient received 1 unit packed red blood cells during the admission #3 acute systolic congestive heart failure: 2-D echocardiogram performed during this hospitalization shows ejection fraction of 30%. This is a new finding, she did not carry this diagnosis in the past. Cause of this is likely arrhythmia related to congestive heart failure. #4 history of CVA/dementia: At baseline patient did not recognize family and was only minimally verbal. Throughout the hospitalization she opened eyes followed simple commands. She was mostly asleep/lethargic. She was also wheelchair or bed bound at baseline.  The patient was maintained in the ICU on the Levophed drip throughout the hospitalization. Though the bleeding into the retroperitoneal hematoma seemed to have stopped as evidenced by stable hemoglobin she continued to be  hypotensive. This did not improve. This is likely related to systolic heart failure. On the morning of 02-24-2023 she developed a wide complex ventricular rhythm and then asystole. Prior to admission the decision had made that she would be a DO NOT RESUSCITATE. Multiple conversations were had with the family during the hospitalization to explain her very poor prognosis. They were reluctant to transition to comfort measures. Most significantly her palliative care team worked with the family throughout the day yesterday but was unsuccessful in reaching the family decision maker.  CONSULTS OBTAINED:  Treatment Team:  Erin Fulling, MD Beau Fanny, MD Mosetta Pigeon, MD  DRUG ALLERGIES:   Allergies  Allergen Reactions  . Penicillins   . Pravastatin     DISCHARGE MEDICATIONS:   Current Discharge Medication List    CONTINUE these medications which have NOT CHANGED   Details  acetaminophen (TYLENOL) 650 MG suppository Place 650 mg rectally every 4 (four) hours as needed.    albuterol (PROVENTIL) (2.5 MG/3ML) 0.083% nebulizer solution Take 2.5 mg by nebulization every 2 (two) hours as needed for wheezing or shortness of breath.    Amino Acids-Protein Hydrolys (FEEDING SUPPLEMENT, PRO-STAT SUGAR FREE 64,) LIQD Take 30 mLs by mouth 2 (two) times daily.    amLODipine (NORVASC) 2.5 MG tablet Take 2.5 mg by mouth daily.    aspirin EC 81 MG tablet Take 81 mg by mouth daily.    clopidogrel (PLAVIX) 75 MG tablet Take 75 mg by mouth daily.    colchicine 0.6 MG tablet Take 0.6 mg by mouth 4 (four) times daily.    donepezil (ARICEPT) 5 MG tablet Take 5  mg by mouth at bedtime.    ergocalciferol (VITAMIN D2) 50000 UNITS capsule Take 50,000 Units by mouth every 30 (thirty) days.    lisinopril (PRINIVIL,ZESTRIL) 20 MG tablet Take 20 mg by mouth daily.    memantine (NAMENDA) 10 MG tablet Take 10 mg by mouth 2 (two) times daily.    metoprolol tartrate (LOPRESSOR) 25 MG tablet Take 25 mg by mouth 2 (two)  times daily.    Multiple Vitamin (MULTIVITAMIN) tablet Take 1 tablet by mouth daily.    polyethylene glycol (MIRALAX / GLYCOLAX) packet Take 17 g by mouth daily.    potassium chloride in sodium chloride 0.45 % 1,000 mL Inject 1 Dose into the vein continuous.    potassium chloride SA (K-DUR,KLOR-CON) 20 MEQ tablet Take 20 mEq by mouth daily.      STOP taking these medications     ertapenem (INVANZ) 1 G SOLR injection          HISTORY OF PRESENT ILLNESS:  Stephanie Salas is a 81 y.o. female with a known history of dementia, hypertension, history of coronary disease status post CABG, history of previous CVA, type 2 diabetes without complication, history of recurrent UTIs, who presented to the hospital due to altered mental status and noted to be hypotensive. Patient apparently has been more lethargic and altered this past week as per the family. Patient herself is quite encephalopathic and lethargic and therefore most of the history obtained from the family at bedside. At baseline patient is able to feed herself at times, she is alert but wheelchair-bound. She for the past week has been more lethargic and altered and her by mouth intake has been poor. She was possibly diagnosed with an infection but unclear source and started on ertapenem at the skilled nursing facility. She was not improving and therefore presented to the ER for further evaluation. Patient was noted to be in shock with systolic blood pressures in the 70s and also encephalopathic and lethargic. Patient is also noted to be in acute renal failure. Hospitalist services were contacted further treatment and evaluation.  VITAL SIGNS:  Blood pressure 88/73, pulse 96, temperature 94.8 F (34.9 C), temperature source Axillary, resp. rate 23, height  (1.676 m), weight 83.7 kg (184 lb 8.4 oz), SpO2 99 %.  I/O:   Intake/Output Summary (Last 24 hours) at 2015/03/12 1341 Last data filed at Mar 12, 2015 0700  Gross per 24 hour  Intake  836.06 ml  Output    200 ml  Net 636.06 ml    DATA REVIEW:   CBC  Recent Labs Lab 02/10/15 0359  WBC 12.5*  HGB 9.2*  HCT 28.1*  PLT 134*    Chemistries   Recent Labs Lab 01/30/2015 1220  02/10/15 0359  NA 147*  < > 141  K 5.1  < > 4.1  CL 117*  < > 117*  CO2 23  < > 20*  GLUCOSE 98  < > 148*  BUN 63*  < > 29*  CREATININE 1.47*  < > 0.62  CALCIUM 7.8*  < > 7.3*  AST 51*  --   --   ALT 36  --   --   ALKPHOS 80  --   --   BILITOT 0.7  --   --   < > = values in this interval not displayed.  Cardiac Enzymes  Recent Labs Lab 02/06/15 1748  TROPONINI 0.23*    Microbiology Results  Results for orders placed or performed during the hospital encounter of  03/01/15  Urine culture     Status: None   Collection Time: 03/01/15 12:47 PM  Result Value Ref Range Status   Specimen Description URINE, CLEAN CATCH  Final   Special Requests Normal  Final   Culture NO GROWTH 2 DAYS  Final   Report Status 02/07/2015 FINAL  Final  Culture, blood (routine x 2)     Status: None   Collection Time: 2015-03-01 12:47 PM  Result Value Ref Range Status   Specimen Description BLOOD RIGHT HAND  Final   Special Requests BOTTLES DRAWN AEROBIC AND ANAEROBIC  3CC  Final   Culture NO GROWTH 5 DAYS  Final   Report Status 02/10/2015 FINAL  Final  Culture, blood (routine x 2)     Status: None   Collection Time: 03/01/2015  1:19 PM  Result Value Ref Range Status   Specimen Description BLOOD LEFT HAND  Final   Special Requests   Final    BOTTLES DRAWN AEROBIC AND ANAEROBIC 1CC AEROBIC,1CC ANAEROBIC   Culture NO GROWTH 5 DAYS  Final   Report Status 02/10/2015 FINAL  Final  MRSA PCR Screening     Status: None   Collection Time: 03/01/2015  8:39 PM  Result Value Ref Range Status   MRSA by PCR NEGATIVE NEGATIVE Final    Comment:        The GeneXpert MRSA Assay (FDA approved for NASAL specimens only), is one component of a comprehensive MRSA colonization surveillance program. It is not intended  to diagnose MRSA infection nor to guide or monitor treatment for MRSA infections.     RADIOLOGY:  Dg Chest 1 View  02/18/2015   CLINICAL DATA:  Shortness of breath.  EXAM: CHEST  1 VIEW  COMPARISON:  02/10/2015.  FINDINGS: Left IJ line in stable position. Prior CABG. Heart size normal. Bibasilar atelectasis and/or infiltrates with small bilateral effusions again noted. No pneumothorax.  IMPRESSION: 1. Left IJ line stable position. 2. Prior CABG.  Heart size stable. 3. Bibasilar atelectasis and/or mild infiltrates. Small bilateral pleural effusions. Similar findings noted on prior exam.   Electronically Signed   By: Maisie Fus  Register   On: 01/25/2015 07:17   Dg Chest 1 View  02/10/2015   CLINICAL DATA:  Dyspnea.  EXAM: CHEST  1 VIEW  COMPARISON:  2015/03/01  FINDINGS: Left IJ line tip:  Cavoatrial junction.  Mild enlargement of the cardiopericardial silhouette with tortuous and atherosclerotic thoracic aorta. Prior CABG.  Low lung volumes are present, causing crowding of the pulmonary vasculature. Interval obscuration of the left hemidiaphragm indicating new left lower lobe airspace opacity.  IMPRESSION: 1. New left lower lobe airspace opacity, possibly from atelectasis or pneumonia. 2. Atherosclerosis and mild cardiomegaly.  Prior CABG.   Electronically Signed   By: Gaylyn Rong M.D.   On: 02/10/2015 14:22   Dg Abd Portable 1v  02/01/2015   CLINICAL DATA:  NG tube replacement.  Patient pulled out tube.  EXAM: PORTABLE ABDOMEN - 1 VIEW  COMPARISON:  02/10/2015  FINDINGS: Enteric tube tip is in the left abdomen consistent with location in the distal stomach. Patient positioning limits examination.  IMPRESSION: Enteric tube tip is over the distal stomach.   Electronically Signed   By: Burman Nieves M.D.   On: 01/29/2015 05:26   Dg Abd Portable 1v  02/10/2015   CLINICAL DATA:  Nasogastric tube placement.  EXAM: PORTABLE ABDOMEN - 1 VIEW  COMPARISON:  02/10/2015  FINDINGS: Feeding tube has been  placed, tip overlying the  level of the proximal stomach. Bowel gas pattern is nonobstructive. There is opacity at the left lung base which obscures the left hemidiaphragm.  IMPRESSION: Interval placement of feeding tube to level of the proximal stomach.  Left lower lobe opacity.   Electronically Signed   By: Norva Pavlov M.D.   On: 02/10/2015 19:03    EKG:   Orders placed or performed during the hospital encounter of February 13, 2015  . EKG 12-Lead  . EKG 12-Lead      Management plans discussed with the patient, family and they are in agreement.  CODE STATUS:     Code Status Orders        Start     Ordered   Feb 13, 2015 1912  Do not attempt resuscitation (DNR)   Continuous    Question Answer Comment  In the event of cardiac or respiratory ARREST Do not call a "code blue"   In the event of cardiac or respiratory ARREST Do not perform Intubation, CPR, defibrillation or ACLS   In the event of cardiac or respiratory ARREST Use medication by any route, position, wound care, and other measures to relive pain and suffering. May use oxygen, suction and manual treatment of airway obstruction as needed for comfort.      02-13-15 1911      TOTAL TIME TAKING CARE OF THIS PATIENT: 20 minutes.  Greater than 50% of time spent in care coordination and counseling. I personally called her daughter this morning to discuss her passing.  Elby Showers M.D on 01/31/2015 at 1:41 PM  Between 7am to 6pm - Pager - [phone]  After 6pm go to [email or web address] - password [email or web address] [facility]  [facility]  Office  [phone]  CC: Primary care physician; No primary care provider on file.

## 2015-02-20 NOTE — Progress Notes (Signed)
Death Pronouncement   Pt was noted to have a ventricular type rhythm just minutes before 10 am.  She had been unresponsive all morning, but NG feedings had been started during the night as the son had requested late last night.  Then at around 10 am, she went into asystole.  There were a couple of agonal respirations and a scant amount of electrical activity on the monitor, but she was definitely asystolic at 10 am.   She was DNR.  She had a cardiomyopathy, and apparently this may have been the cause of death --though I would defer to Dr. Claris Che note for details and cause of death.  I spoke with Dr. Clent Ridges and called the pt's daughter and notified her.  I also spoke with the daughter after she arrived .  Pt is pronounced dead at 10 am today.  There was no rhythm on telemetry and there were no breath sounds or heart sounds and no visible respirations or pulses. Pupils were fixed and dilated.    Cammie Mcgee, MD

## 2015-02-20 NOTE — Progress Notes (Signed)
Pt expired at 10:00, Dr. Orvan Falconer at bedside to pronounce pt.

## 2015-02-20 NOTE — Progress Notes (Signed)
[facility] Critical Care Medicine Progess Note    ASSESSMENT/PLAN  81 yo AAF with acute mental status/encephlopathy with acute abd pain with acute hypovolumic shock from acute retro-pertioneal hematomas  PULMONARY -Currently no oxygen needs.  CARDIOVASCULAR -Hypovolumic shock-IVF's,  -Continue intravenous fluids, Will wean down Levophed as tolerated. -follow h/h, transfuse as needed, wean down levophed. Can wean down pressors to a goal systolic blood pressure of greater than 90, now that the patient's kidney functions have significantly improved. If still not improved. Can consider starting Midodrin, if the patient is able to take oral medications. -Start empiric steroids.   RENAL -ARF from ATN -follow chem 7 Follow UO  GASTROINTESTINAL Acute retro perotoneal hematoma, was on plavix at home. Hb now stable.  -advance diet as tolerated, will start bowel regimen.   HEMATOLOGIC Follow h/h transfuse as needed  INFECTIOUS -no source at this time   ENDOCRINE -Follow fsbs  NEUROLOGIC encephlopathy from shock/acidosis, this now appears to be improved and the patient appears to be near baseline. -avoid sedatives  Discussed with critical care RN, pharmacy, nutrition, and palliative care. Pt would be appropriate for hospice, though the family continues to want current medical therapy with aggressive care. However, the patient is still DO NOT RESUSCITATE.    ---------------------------------------   ----------------------------------------   Name: Stephanie Salas MRN: [id number] DOB: [birth year]    ADMISSION DATE:  02/19/2015    CHIEF COMPLAINT:  BELLY PAIN.    STUDIES:     SUBJECTIVE:  Patient has no new complaints today.   Review of Systems:  Patient currently minimally verbal and cannot provide a review of systems.   VITAL SIGNS: Temp:  [94.8 F (34.9 C)-97.8 F (36.6 C)] 94.8 F (34.9 C) (09/22 0700) Pulse Rate:  [83-103] 96 (09/22  0800) Resp:  [14-33] 23 (09/22 0930) BP: (31-118)/(15-74) 88/73 mmHg (09/22 0930) SpO2:  [94 %-100 %] 99 % (09/22 0800) Weight:  [83.7 kg (184 lb 8.4 oz)] 83.7 kg (184 lb 8.4 oz) (09/22 0530) HEMODYNAMICS: CVP:  [9 mmHg-15 mmHg] 10 mmHg VENTILATOR SETTINGS:   INTAKE / OUTPUT:  Intake/Output Summary (Last 24 hours) at 02-27-2015 1135 Last data filed at Feb 27, 2015 0700  Gross per 24 hour  Intake 1443.03 ml  Output    200 ml  Net 1243.03 ml    PHYSICAL EXAMINATION: Physical Examination:   VS: BP 88/73 mmHg  Pulse 96  Temp(Src) 94.8 F (34.9 C) (Axillary)  Resp 23  Ht  (1.676 m)  Wt 83.7 kg (184 lb 8.4 oz)  BMI 29.80 kg/m2  SpO2 99%  General Appearance: No distress  Neuro:without focal findings, mental status normal. HEENT: PERRLA, EOM intact. Pulmonary: normal breath sounds   CardiovascularNormal S1,S2.  No m/r/g.   Abdomen: Benign, Soft, non-tender. Renal:  No costovertebral tenderness  GU:  Not performed at this time. Endocrine: No evident thyromegaly. Skin:   warm, no rashes, no ecchymosis  Extremities: normal, no cyanosis, clubbing.   LABS:   LABORATORY PANEL:   CBC  Recent Labs Lab 02/10/15 0359  WBC 12.5*  HGB 9.2*  HCT 28.1*  PLT 134*    Chemistries   Recent Labs Lab 01/27/2015 1220  02/10/15 0359  NA 147*  < > 141  K 5.1  < > 4.1  CL 117*  < > 117*  CO2 23  < > 20*  GLUCOSE 98  < > 148*  BUN 63*  < > 29*  CREATININE 1.47*  < > 0.62  CALCIUM 7.8*  < >  7.3*  AST 51*  --   --   ALT 36  --   --   ALKPHOS 80  --   --   BILITOT 0.7  --   --   < > = values in this interval not displayed.   Recent Labs Lab 02/10/15 1611 02/10/15 2111 02/10/15 2242 01/23/2015 0032 02/14/2015 0401 02/18/2015 0732  GLUCAP 121* 136* 132* 161* 167* 164*    Recent Labs Lab 2015-02-18 1630  PHART 7.35  PCO2ART 40  PO2ART 105    Recent Labs Lab 02/18/15 1220  AST 51*  ALT 36  ALKPHOS 80  BILITOT 0.7  ALBUMIN 1.5*    Cardiac Enzymes  Recent  Labs Lab 02/06/15 1748  TROPONINI 0.23*    RADIOLOGY:  Dg Chest 1 View  02/07/2015   CLINICAL DATA:  Shortness of breath.  EXAM: CHEST  1 VIEW  COMPARISON:  02/10/2015.  FINDINGS: Left IJ line in stable position. Prior CABG. Heart size normal. Bibasilar atelectasis and/or infiltrates with small bilateral effusions again noted. No pneumothorax.  IMPRESSION: 1. Left IJ line stable position. 2. Prior CABG.  Heart size stable. 3. Bibasilar atelectasis and/or mild infiltrates. Small bilateral pleural effusions. Similar findings noted on prior exam.   Electronically Signed   By: Maisie Fus  Register   On: 01/26/2015 07:17   Dg Chest 1 View  02/10/2015   CLINICAL DATA:  Dyspnea.  EXAM: CHEST  1 VIEW  COMPARISON:  02/18/15  FINDINGS: Left IJ line tip:  Cavoatrial junction.  Mild enlargement of the cardiopericardial silhouette with tortuous and atherosclerotic thoracic aorta. Prior CABG.  Low lung volumes are present, causing crowding of the pulmonary vasculature. Interval obscuration of the left hemidiaphragm indicating new left lower lobe airspace opacity.  IMPRESSION: 1. New left lower lobe airspace opacity, possibly from atelectasis or pneumonia. 2. Atherosclerosis and mild cardiomegaly.  Prior CABG.   Electronically Signed   By: Gaylyn Rong M.D.   On: 02/10/2015 14:22   Dg Abd Portable 1v  02/18/2015   CLINICAL DATA:  NG tube replacement.  Patient pulled out tube.  EXAM: PORTABLE ABDOMEN - 1 VIEW  COMPARISON:  02/10/2015  FINDINGS: Enteric tube tip is in the left abdomen consistent with location in the distal stomach. Patient positioning limits examination.  IMPRESSION: Enteric tube tip is over the distal stomach.   Electronically Signed   By: Burman Nieves M.D.   On: 02/16/2015 05:26   Dg Abd Portable 1v  02/10/2015   CLINICAL DATA:  Nasogastric tube placement.  EXAM: PORTABLE ABDOMEN - 1 VIEW  COMPARISON:  02/10/2015  FINDINGS: Feeding tube has been placed, tip overlying the level of the  proximal stomach. Bowel gas pattern is nonobstructive. There is opacity at the left lung base which obscures the left hemidiaphragm.  IMPRESSION: Interval placement of feeding tube to level of the proximal stomach.  Left lower lobe opacity.   Electronically Signed   By: Norva Pavlov M.D.   On: 02/10/2015 19:03       --Wells Guiles, MD.  [facility]  Santiago Glad, M.D.  Stephanie Acre, M.D.  Billy Fischer, M.D

## 2015-02-20 NOTE — Progress Notes (Signed)
Ruled out for all donation by cds, ann Museum/gallery exhibitions officer) aware

## 2015-02-20 NOTE — Progress Notes (Signed)
Palliative Medicine Inpatient Consult Follow Up Note   Name: Stephanie Salas Date: 02/01/2015 MRN: [id number]  DOB: 27-Oct-1923  Referring Physician: Gale Journey, MD  Palliative Care consult requested for this 80 y.o. female for goals of medical therapy in patient with a recent GI bleed and a cardiomyopathy --still on pressors and now with NG feedings going in per son's request last night.  Patient has been giving [place] signs of dying for a week now.  I came to the CCU just as the nurse paged me to let me know that her HR had changed to an apparent Ventricular rhythm.  Pt is DNR status.   PLAN: 1.  DNR continues 2.  Patient was examined by me while she was alive, but she quickly changed to an asystolic rhythm while I was calling the daughter.  I went to examine pt and found that she had just a couple of agonal breaths and some random electrical activity that settled down to consistent asystole at 10 am.  Dr Clent Ridges was notified and I spoke with her in person about pt.  3.  Daughter arrived and I provided supportive conversation at this time of grief.  Chaplain is here and available if they need him.  See pronouncement of death note.    REVIEW OF SYSTEMS:  Patient is not able to provide ROS due to critical illness  CODE STATUS: DNR   PAST MEDICAL HISTORY: Past Medical History  Diagnosis Date  . Hypertension   . Diabetes mellitus without complication   . Dementia   . Recurrent UTI   . Hx of CABG   . History of CVA (cerebrovascular accident)   . Gout     PAST SURGICAL HISTORY: History reviewed. No pertinent past surgical history.  Vital Signs: BP 88/73 mmHg  Pulse 96  Temp(Src) 94.8 F (34.9 C) (Axillary)  Resp 23  Ht  (1.676 m)  Wt 83.7 kg (184 lb 8.4 oz)  BMI 29.80 kg/m2  SpO2 99% Filed Weights   Feb 07, 2015 1211 02-07-15 1900 02/14/2015 0530  Weight: 79.788 kg (175 lb 14.4 oz) 71.8 kg (158 lb 4.6 oz) 83.7 kg (184 lb 8.4 oz)    Estimated body mass index is 29.8  kg/(m^2) as calculated from the following:   Height as of this encounter:  (1.676 m).   Weight as of this encounter: 83.7 kg (184 lb 8.4 oz).  PHYSICAL EXAM: Unresponsive Now in a ventricular rhythm (rate under 100 --but no p waves just now) Eyes closed  Neck with JVD Heart rrr no mgr Lungs with ronchi Abd distant BS Ext --pulses barely palpable , thready  LABS: CBC:    Component Value Date/Time   WBC 12.5* 02/10/2015 0359   WBC 8.1 01/02/2012 0500   HGB 9.2* 02/10/2015 0359   HGB 11.5* 01/02/2012 0500   HCT 28.1* 02/10/2015 0359   HCT 34.8* 01/02/2012 0500   PLT 134* 02/10/2015 0359   PLT 143* 01/02/2012 0500   MCV 82.5 02/10/2015 0359   MCV 82 01/02/2012 0500   NEUTROABS 7.6* 02-07-2015 1220   NEUTROABS 5.6 01/02/2012 0500   LYMPHSABS 0.8* 02-07-2015 1220   LYMPHSABS 1.6 01/02/2012 0500   MONOABS 0.3 2015/02/07 1220   MONOABS 0.8 01/02/2012 0500   EOSABS 0.0 February 07, 2015 1220   EOSABS 0.0 01/02/2012 0500   BASOSABS 0.0 02-07-15 1220   BASOSABS 0.1 01/02/2012 0500   Comprehensive Metabolic Panel:    Component Value Date/Time   NA 141 02/10/2015 0359  NA 139 01/02/2012 0500   K 4.1 02/10/2015 0359   K 3.5 01/02/2012 0500   CL 117* 02/10/2015 0359   CL 101 01/02/2012 0500   CO2 20* 02/10/2015 0359   CO2 28 01/02/2012 0500   BUN 29* 02/10/2015 0359   BUN 17 01/02/2012 0500   CREATININE 0.62 02/10/2015 0359   CREATININE 1.10 01/02/2012 0500   GLUCOSE 148* 02/10/2015 0359   GLUCOSE 145* 01/02/2012 0500   CALCIUM 7.3* 02/10/2015 0359   CALCIUM 8.6 01/02/2012 0500   AST 51* 01/28/2015 1220   AST 35 01/01/2012 0109   ALT 36 01/25/2015 1220   ALT 23 01/01/2012 0109   ALKPHOS 80 01/30/2015 1220   ALKPHOS 105 01/01/2012 0109   BILITOT 0.7 02/03/2015 1220   BILITOT 0.4 01/01/2012 0109   PROT 6.2* 02/12/2015 1220   PROT 7.8 01/01/2012 0109   ALBUMIN 1.5* 02/06/2015 1220   ALBUMIN 3.3* 01/01/2012 0109     More than 50% of the visit was spent in  counseling/coordination of care: YES  Time Spent:  40 min

## 2015-02-20 NOTE — Progress Notes (Signed)
pts rhythm changed to ventricular rhythm, max levophed, and hypothermia issues, daughter notified of these changes and waiting on callback from palliative Dr. Orvan Falconer.

## 2015-02-20 DEATH — deceased

## 2016-02-18 IMAGING — CT CT ABD-PELV W/O CM
2 of 4 series · 16 of 46 positions shown, 18 images · non-contrast
Comparison: None.

CLINICAL DATA: Altered mental status, hypotension. Acute renal
failure. Abdominal pain.

EXAM:
CT ABDOMEN AND PELVIS WITHOUT CONTRAST
TECHNIQUE: Multidetector CT imaging of the abdomen and pelvis was performed
following the standard protocol without IV contrast.

[Series 2: routine abd pel wo · axial · 0.68mm/px · z∈[-862,-448]mm · 13 of 91 slices shown, 15 images]
[im 4/91  soft-tissue]
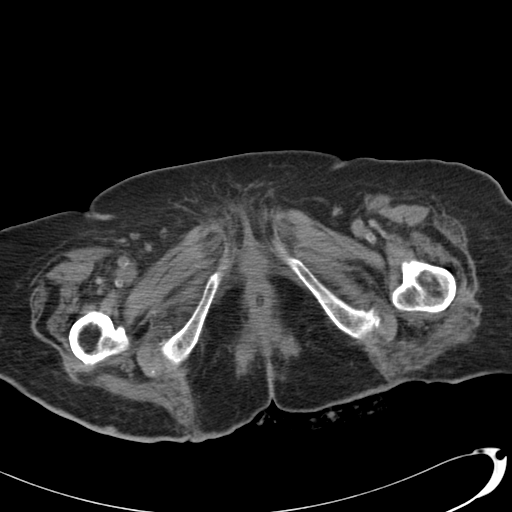
[im 4/91  bone]
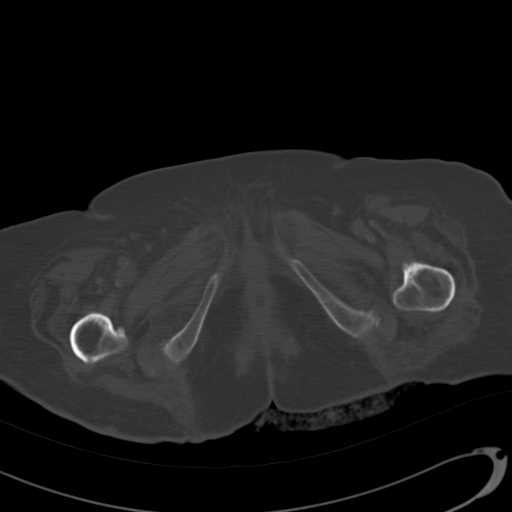
[im 11/91  soft-tissue]
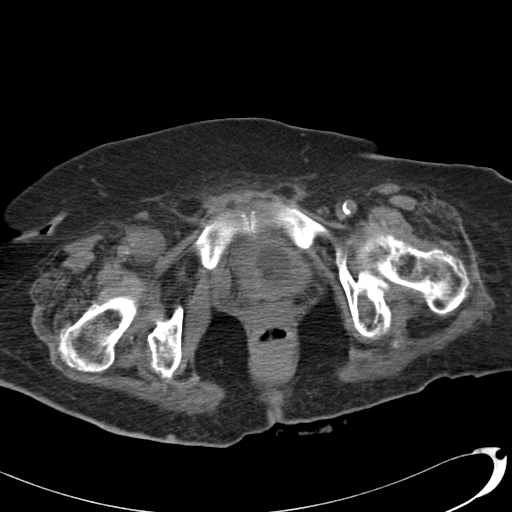
[im 19/91  soft-tissue]
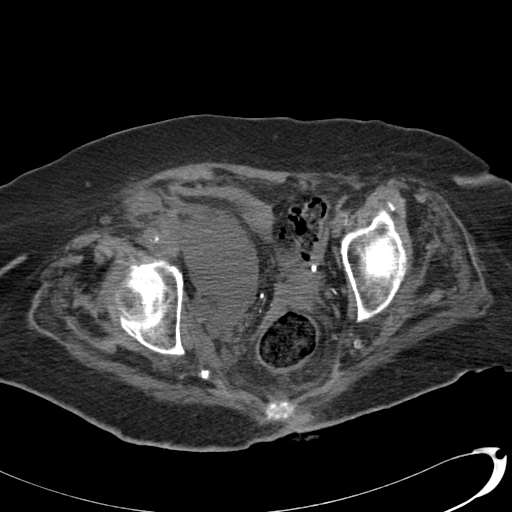
[im 26/91  soft-tissue]
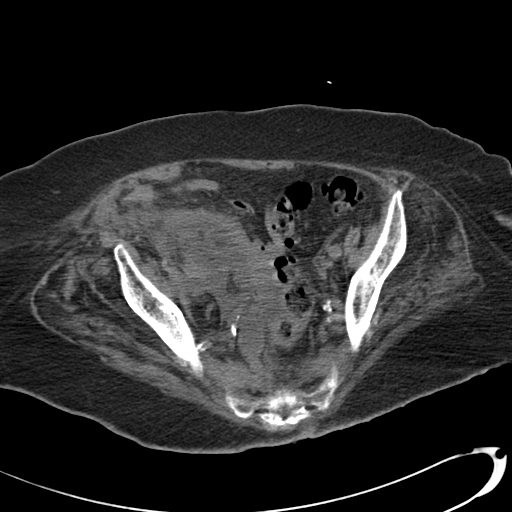
[im 33/91  soft-tissue]
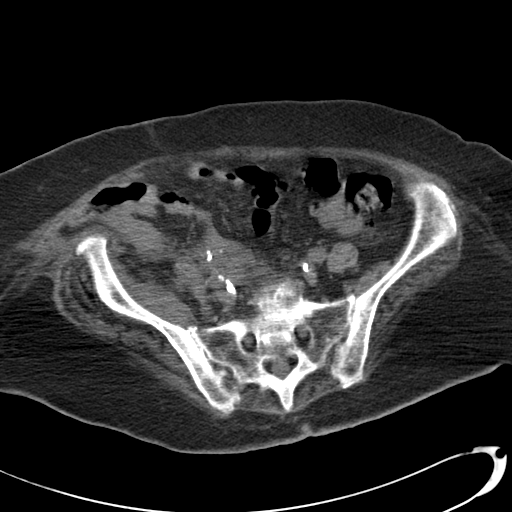
[im 40/91  soft-tissue]
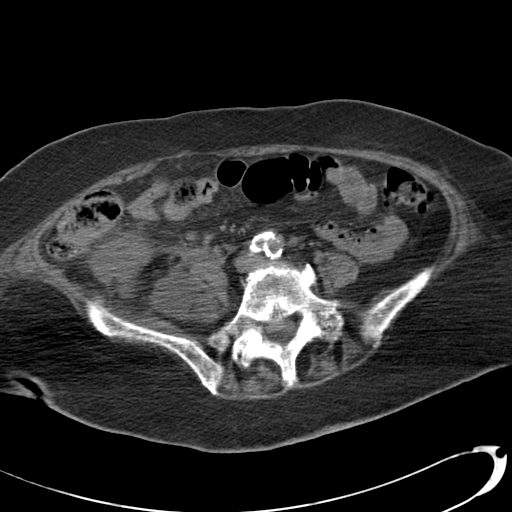
[im 47/91  soft-tissue]
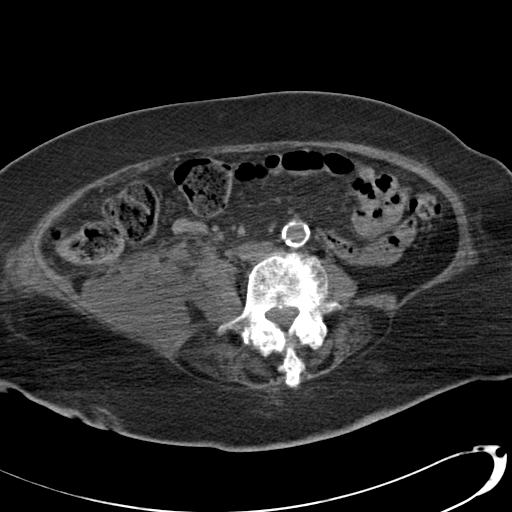
[im 51/91  soft-tissue]
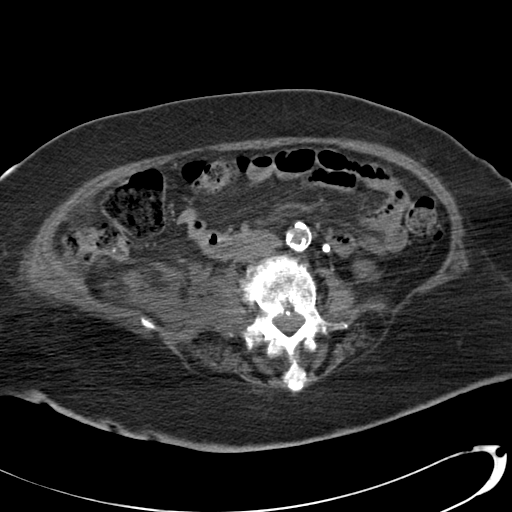
[im 58/91  soft-tissue]
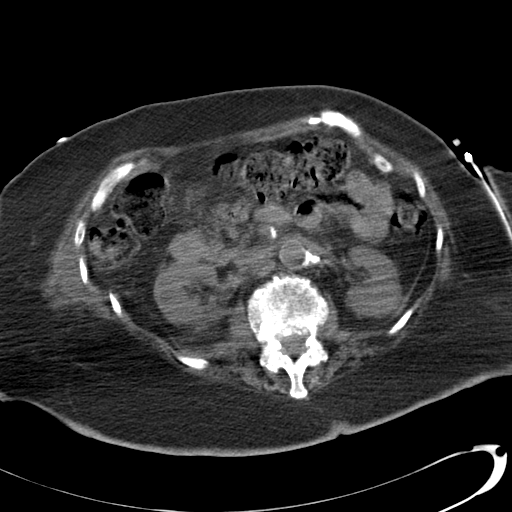
[im 58/91  bone]
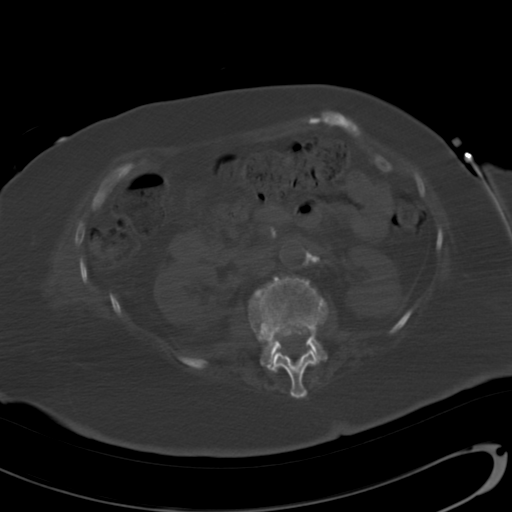
[im 65/91  soft-tissue]
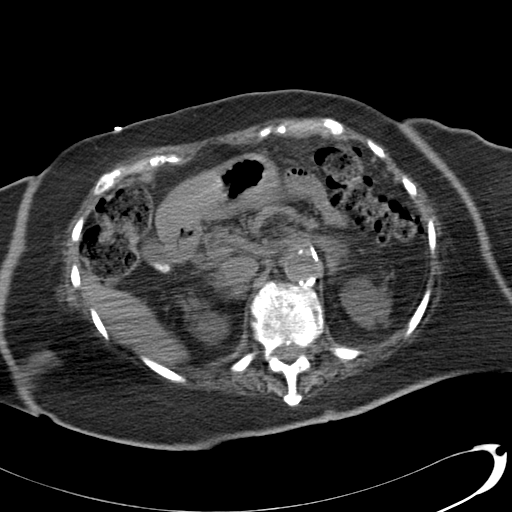
[im 73/91  soft-tissue]
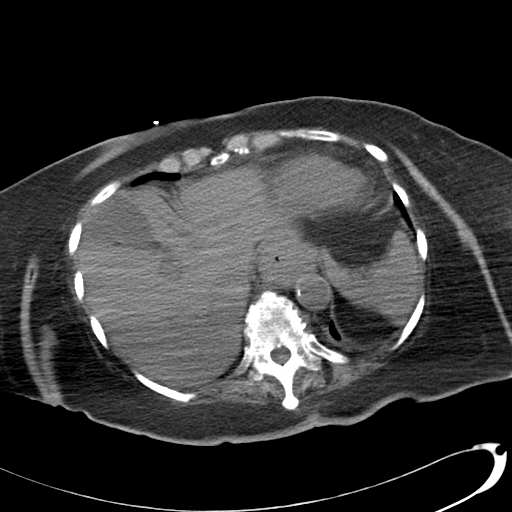
[im 80/91  soft-tissue]
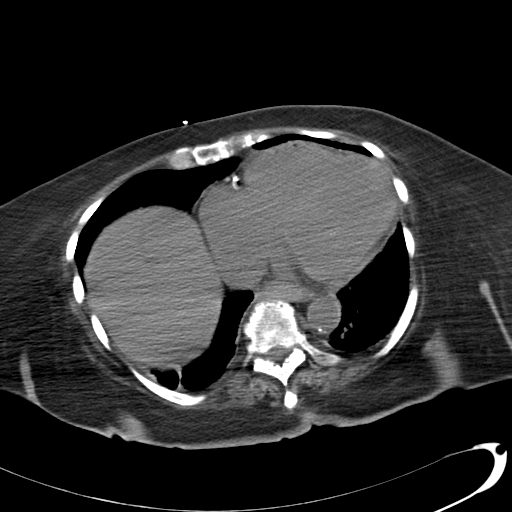
[im 87/91  soft-tissue]
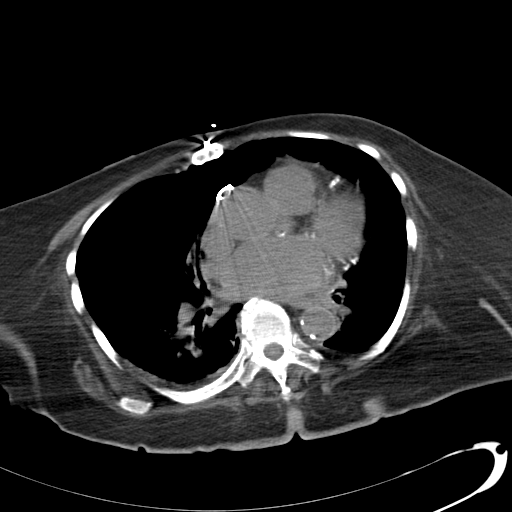

[Series 5: cor routine abd pel wo · coronal · 0.76mm/px · 3 of 121 slices shown]
[im 41/121  soft-tissue]
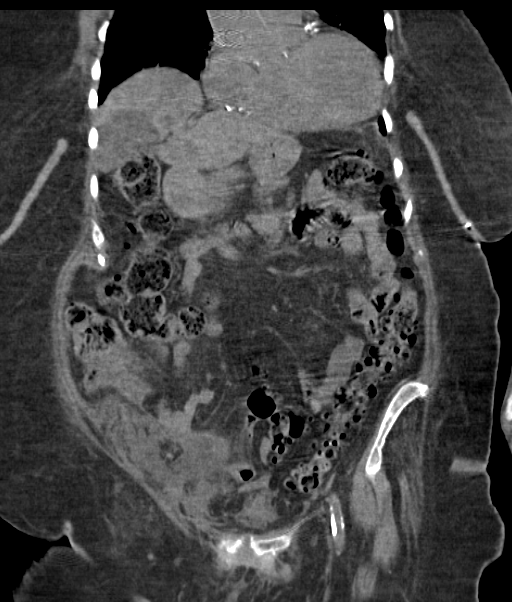
[im 54/121  soft-tissue]
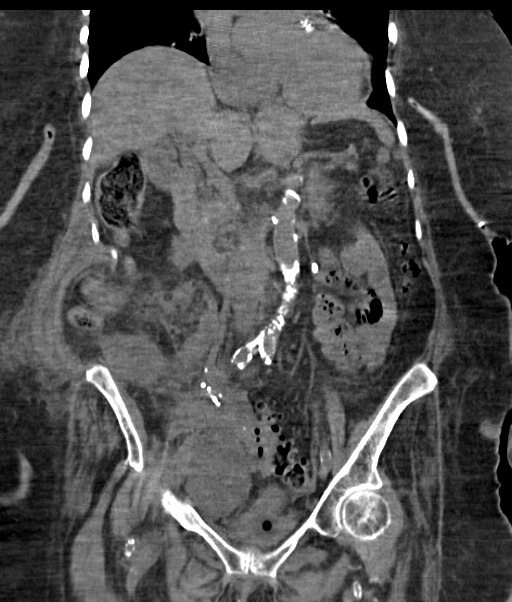
[im 67/121  soft-tissue]
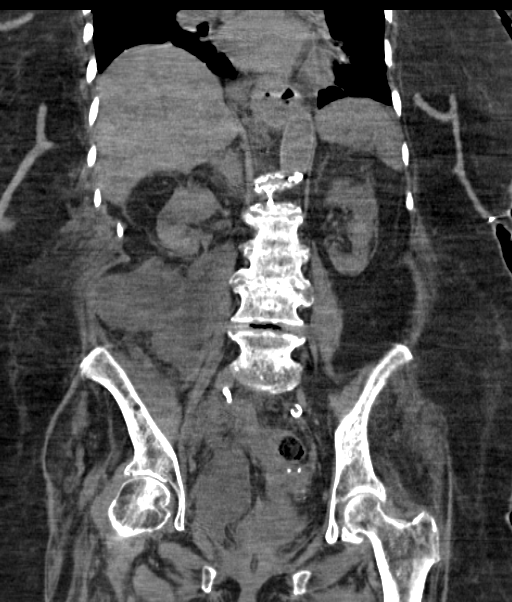

[16 of 46 positions shown; findings below may reference images not displayed]

FINDINGS: There is mild cardiomegaly. Linear areas of atelectasis or scarring
in the lung bases. No pleural effusions.

Layering gallstones within the gallbladder. Liver, spleen, kidneys
have an unremarkable unenhanced appearance. Adrenal glands are
mildly full, all likely hyperplasia. Fatty replacement of the
pancreas without focal lesion.

Urinary bladder is decompressed with Foley catheter in place.

Diffuse colonic diverticulosis. No active diverticulitis. Stomach
and small bowel are decompressed.

There is fluid noted in the right retroperitoneum adjacent to the
right psoas muscle and ileo psoas muscle compatible with
retroperitoneal hematoma. This ex ********* head this measures
approximately 10.7 x 5.7 cm on image 44. Separate extraperitoneal
lenticular shaped area noted along the right pelvic side wall
measuring 8.5 x 4.9 cm. This also likely represents extraperitoneal
hematoma. This has mass effect on the bladder.

No acute bony abnormality or focal bone lesion. Diffuse degenerative
disc and facet disease throughout the thoracolumbar spine.
IMPRESSION: Right retroperitoneal and extraperitoneal fluid collections/ soft
tissue most compatible with retroperitoneal hematoma.

Cholelithiasis.

Mild cardiomegaly.  Atelectasis or scarring in the lung bases.

## 2016-02-24 IMAGING — CR DG CHEST 1V
1 series · 1 of 1 positions shown · non-contrast
Comparison: 02/10/2015.

CLINICAL DATA: Shortness of breath.

EXAM:
CHEST  1 VIEW

[portable]
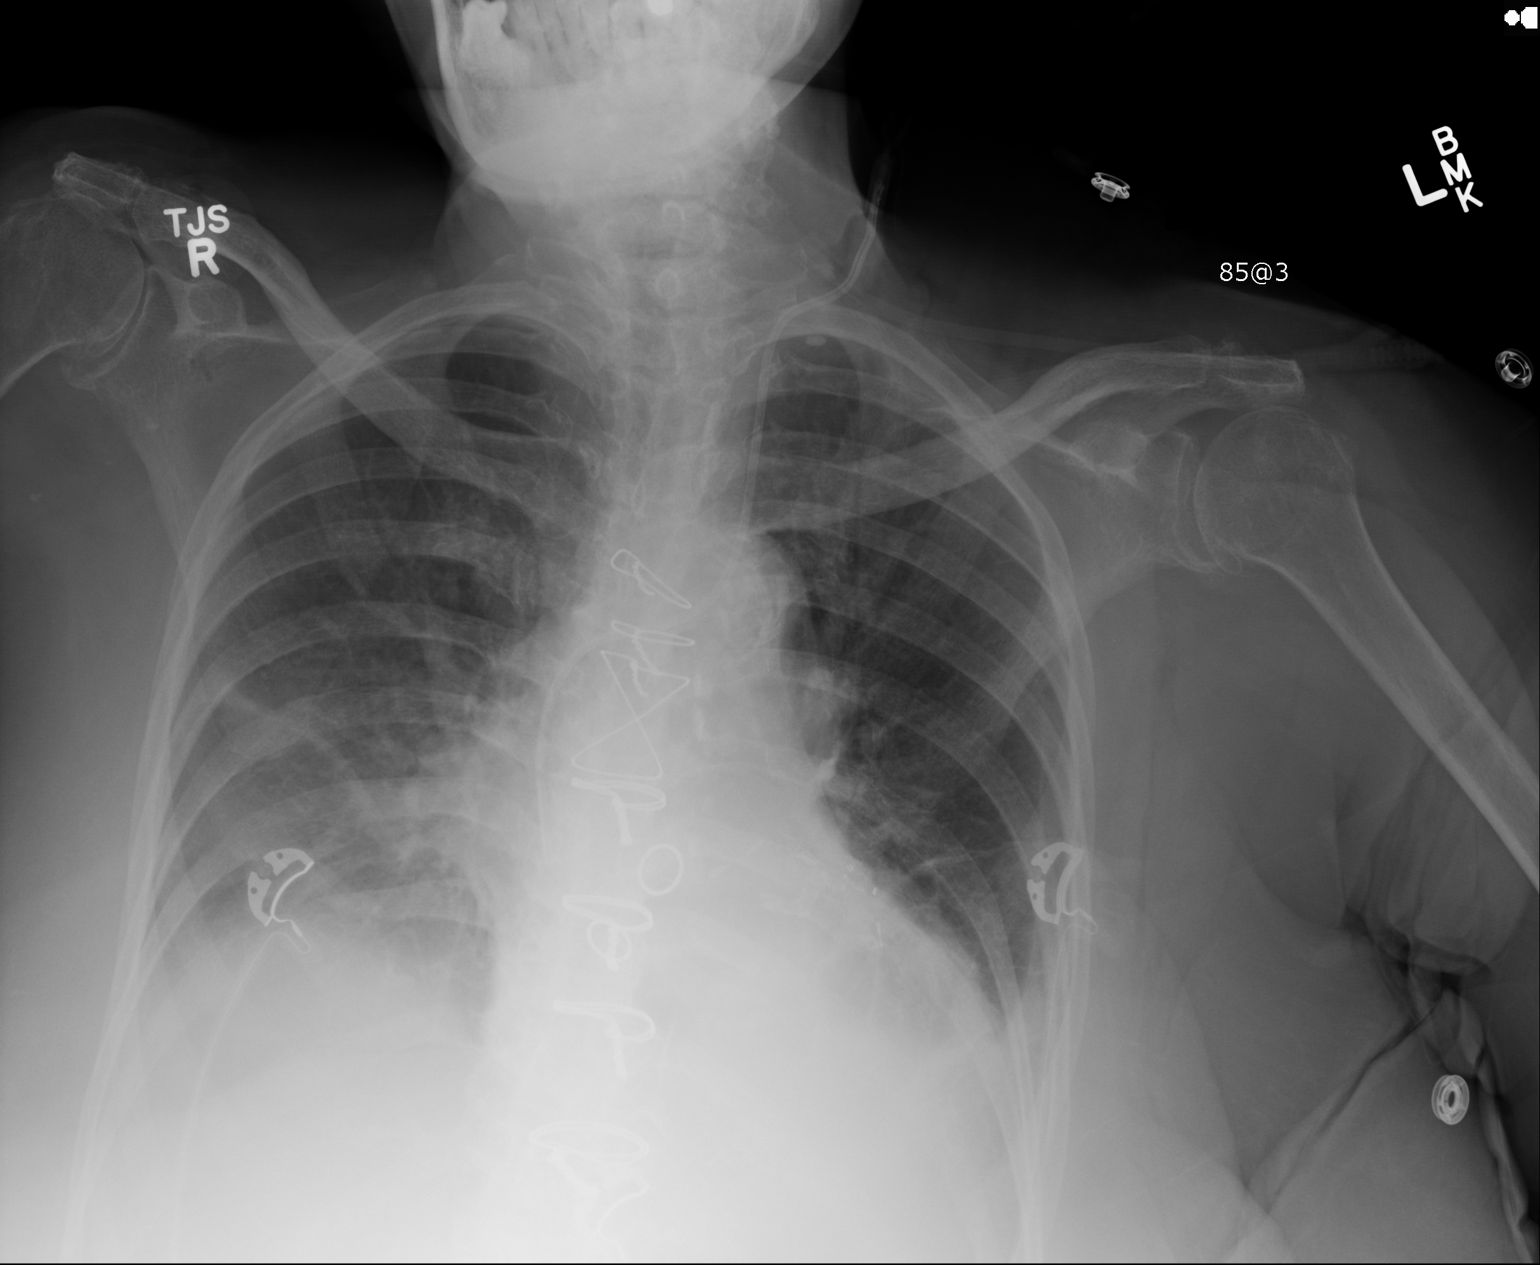

[1 of 1 positions shown; findings below may reference images not displayed]

FINDINGS: Left IJ line in stable position. Prior CABG. Heart size normal.
Bibasilar atelectasis and/or infiltrates with small bilateral
effusions again noted. No pneumothorax.
IMPRESSION: 1. Left IJ line stable position.
2. Prior CABG.  Heart size stable.
3. Bibasilar atelectasis and/or mild infiltrates. Small bilateral
pleural effusions. Similar findings noted on prior exam.
